# Patient Record
Sex: Female | Born: 1995 | Race: White | Hispanic: No | Marital: Single | Smoking: Former smoker
Health system: Southern US, Community
[De-identification: ages and names within clinical notes are randomized; demographics above are authoritative.]

## PROBLEM LIST (undated history)

## (undated) DIAGNOSIS — Z789 Other specified health status: Secondary | ICD-10-CM

## (undated) DIAGNOSIS — S129XXA Fracture of neck, unspecified, initial encounter: Secondary | ICD-10-CM

## (undated) DIAGNOSIS — D649 Anemia, unspecified: Secondary | ICD-10-CM

## (undated) DIAGNOSIS — F419 Anxiety disorder, unspecified: Secondary | ICD-10-CM

## (undated) HISTORY — DX: Anxiety disorder, unspecified: F41.9

## (undated) HISTORY — DX: Anemia, unspecified: D64.9

## (undated) HISTORY — DX: Fracture of neck, unspecified, initial encounter: S12.9XXA

## (undated) HISTORY — PX: NO PAST SURGERIES: SHX2092

---

## 2018-11-07 ENCOUNTER — Encounter (HOSPITAL_COMMUNITY): Payer: Self-pay

## 2018-11-07 ENCOUNTER — Other Ambulatory Visit: Payer: Self-pay

## 2018-11-07 ENCOUNTER — Inpatient Hospital Stay (HOSPITAL_COMMUNITY)
Admission: EM | Admit: 2018-11-07 | Discharge: 2018-11-07 | Disposition: A | Discharge status: Home / Self Care | Payer: 59 | Attending: Obstetrics and Gynecology | Admitting: Obstetrics and Gynecology

## 2018-11-07 ENCOUNTER — Inpatient Hospital Stay (HOSPITAL_COMMUNITY): Payer: 59

## 2018-11-07 DIAGNOSIS — O4690 Antepartum hemorrhage, unspecified, unspecified trimester: Secondary | ICD-10-CM

## 2018-11-07 DIAGNOSIS — F17210 Nicotine dependence, cigarettes, uncomplicated: Secondary | ICD-10-CM | POA: Diagnosis not present

## 2018-11-07 DIAGNOSIS — O99331 Smoking (tobacco) complicating pregnancy, first trimester: Secondary | ICD-10-CM | POA: Diagnosis not present

## 2018-11-07 DIAGNOSIS — Z6790 Unspecified blood type, Rh positive: Secondary | ICD-10-CM

## 2018-11-07 DIAGNOSIS — Z3A01 Less than 8 weeks gestation of pregnancy: Secondary | ICD-10-CM | POA: Diagnosis not present

## 2018-11-07 DIAGNOSIS — O3680X0 Pregnancy with inconclusive fetal viability, not applicable or unspecified: Secondary | ICD-10-CM | POA: Diagnosis not present

## 2018-11-07 DIAGNOSIS — O209 Hemorrhage in early pregnancy, unspecified: Secondary | ICD-10-CM | POA: Diagnosis not present

## 2018-11-07 HISTORY — DX: Other specified health status: Z78.9

## 2018-11-07 LAB — CBC
HCT: 40.4 % (ref 36.0–46.0)
Hemoglobin: 13.4 g/dL (ref 12.0–15.0)
MCH: 28.7 pg (ref 26.0–34.0)
MCHC: 33.2 g/dL (ref 30.0–36.0)
MCV: 86.5 fL (ref 80.0–100.0)
Platelets: 340 10*3/uL (ref 150–400)
RBC: 4.67 MIL/uL (ref 3.87–5.11)
RDW: 12 % (ref 11.5–15.5)
WBC: 9.3 10*3/uL (ref 4.0–10.5)
nRBC: 0 % (ref 0.0–0.2)

## 2018-11-07 LAB — URINALYSIS, ROUTINE W REFLEX MICROSCOPIC
Bacteria, UA: NONE SEEN
Bilirubin Urine: NEGATIVE
Glucose, UA: NEGATIVE mg/dL
Ketones, ur: NEGATIVE mg/dL
Leukocytes,Ua: NEGATIVE
Nitrite: NEGATIVE
Protein, ur: NEGATIVE mg/dL
Specific Gravity, Urine: 1.014 (ref 1.005–1.030)
pH: 8 (ref 5.0–8.0)

## 2018-11-07 LAB — COMPREHENSIVE METABOLIC PANEL
ALT: 18 U/L (ref 0–44)
AST: 19 U/L (ref 15–41)
Albumin: 4.6 g/dL (ref 3.5–5.0)
Alkaline Phosphatase: 73 U/L (ref 38–126)
Anion gap: 10 (ref 5–15)
BUN: 8 mg/dL (ref 6–20)
CO2: 25 mmol/L (ref 22–32)
Calcium: 10.3 mg/dL (ref 8.9–10.3)
Chloride: 103 mmol/L (ref 98–111)
Creatinine, Ser: 0.64 mg/dL (ref 0.44–1.00)
GFR calc Af Amer: >60 mL/min (ref >60)
GFR calc non Af Amer: >60 mL/min (ref >60)
Glucose, Bld: 95 mg/dL (ref 70–99)
Potassium: 3.9 mmol/L (ref 3.5–5.1)
Sodium: 138 mmol/L (ref 135–145)
Total Bilirubin: 0.7 mg/dL (ref 0.3–1.2)
Total Protein: 7.5 g/dL (ref 6.5–8.1)

## 2018-11-07 LAB — WET PREP, GENITAL
Sperm: NONE SEEN
Trich, Wet Prep: NONE SEEN
Yeast Wet Prep HPF POC: NONE SEEN

## 2018-11-07 LAB — POCT PREGNANCY, URINE: Preg Test, Ur: POSITIVE — AB

## 2018-11-07 LAB — HCG, QUANTITATIVE, PREGNANCY: hCG, Beta Chain, Quant, S: 990 m[IU]/mL — ABNORMAL HIGH (ref <5)

## 2018-11-07 NOTE — ED Triage Notes (Signed)
Report to [facility] transport

## 2018-11-07 NOTE — MAU Provider Note (Signed)
History     CSN: [id number]  Arrival date and time: 11/07/18 1257   First Provider Initiated Contact with Patient 11/07/18 1429      Chief Complaint  Patient presents with  . Abdominal Pain  . Vaginal Bleeding   [name] is a 23 y.o. G1P0 at [redacted]w[redacted]d who presents to MAU for vaginal bleeding which began yesterday. Pt reports the bleeding started out as spotting, after intercourse had a slight increase in spotting and then last night reports that the bleeding "turned into a period."  Passing blood clots? no Blood soaking clothes? no Lightheaded/dizzy? no Significant pelvic pain or cramping? yes, started a few days ago, worse last night, no pain this morning Passed any tissue? no Hx ectopic pregnancy? no Hx of PID, GYN surgery? no  Current pregnancy problems? abnormally rising hCG Blood Type? unknown Allergies? NKDA Current medications? PNVs Current [facility] & next appt? [place], 11/09/2018  Pt denies vaginal discharge/odor/itching. Pt denies N/V, abdominal pain, constipation, diarrhea, or urinary problems. Pt denies fever, chills, fatigue, sweating or changes in appetite. Pt denies SOB or chest pain. Pt denies dizziness, HA, light-headedness, weakness.  Pt's boyfriend present for entire visit.   OB History    Gravida  1   Para      Term      Preterm      AB      Living        SAB      TAB      Ectopic      Multiple      Live Births              Past Medical History:  Diagnosis Date  . Medical history non-contributory     Past Surgical History:  Procedure Laterality Date  . NO PAST SURGERIES      History reviewed. No pertinent family history.  Social History   Tobacco Use  . Smoking status: Current Every Day Smoker    Types: Cigarettes  . Smokeless tobacco: Never Used  Substance Use Topics  . Alcohol use: Not Currently  . Drug use: Never    Allergies: No Known Allergies  No medications prior to admission.    Review of  Systems  Constitutional: Negative for chills, diaphoresis, fatigue and fever.  Respiratory: Negative for shortness of breath.   Cardiovascular: Negative for chest pain.  Gastrointestinal: Negative for abdominal pain, constipation, diarrhea, nausea and vomiting.  Genitourinary: Positive for vaginal bleeding. Negative for dysuria, flank pain, frequency, pelvic pain, urgency and vaginal discharge.  Neurological: Negative for dizziness, weakness, light-headedness and headaches.   Physical Exam   Blood pressure 117/76, pulse 81, temperature 98.3 F (36.8 C), temperature source Oral, resp. rate 18, last menstrual period 09/19/2018, SpO2 99 %.  Patient Vitals for the past 24 hrs:  BP Temp Temp src Pulse Resp SpO2  11/07/18 1415 117/76 98.3 F (36.8 C) Oral 81 18 99 %   Physical Exam  Constitutional: She is oriented to person, place, and time. She appears well-developed and well-nourished. No distress.  HENT:  Head: Normocephalic and atraumatic.  Respiratory: Effort normal.  GI: Soft. She exhibits no distension and no mass. There is no abdominal tenderness. There is no rebound and no guarding.  Genitourinary: There is no rash, tenderness or lesion on the right labia. There is no rash, tenderness or lesion on the left labia. Uterus is not enlarged and not tender. Cervix exhibits no motion tenderness, no discharge and no friability. Right adnexum  displays no mass, no tenderness and no fullness. Left adnexum displays no mass, no tenderness and no fullness.    Vaginal bleeding (scant, liquid blood in vault) present.     No vaginal discharge or tenderness.  There is bleeding (scant, liquid blood in vault) in the vagina. No tenderness in the vagina.    Genitourinary Comments: On exam, long/thin tissue found wrapped around cervix in fornices. Removed gently with single Fox swab and sent to pathology.   Neurological: She is alert and oriented to person, place, and time.  Skin: Skin is warm and dry. She  is not diaphoretic.  Psychiatric: She has a normal mood and affect. Her behavior is normal. Judgment and thought content normal.   Results for orders placed or performed during the hospital encounter of 11/07/18 (from the past 24 hour(s))  Pregnancy, urine POC     Status: Abnormal   Collection Time: 11/07/18  2:02 PM  Result Value Ref Range   Preg Test, Ur POSITIVE (A) NEGATIVE  Urinalysis, Routine w reflex microscopic     Status: Abnormal   Collection Time: 11/07/18  2:03 PM  Result Value Ref Range   Color, Urine YELLOW YELLOW   APPearance CLEAR CLEAR   Specific Gravity, Urine 1.014 1.005 - 1.030   pH 8.0 5.0 - 8.0   Glucose, UA NEGATIVE NEGATIVE mg/dL   Hgb urine dipstick LARGE (A) NEGATIVE   Bilirubin Urine NEGATIVE NEGATIVE   Ketones, ur NEGATIVE NEGATIVE mg/dL   Protein, ur NEGATIVE NEGATIVE mg/dL   Nitrite NEGATIVE NEGATIVE   Leukocytes,Ua NEGATIVE NEGATIVE   RBC / HPF 21-50 0 - 5 RBC/hpf   WBC, UA 0-5 0 - 5 WBC/hpf   Bacteria, UA NONE SEEN NONE SEEN   Squamous Epithelial / LPF 0-5 0 - 5   Mucus PRESENT   CBC     Status: None   Collection Time: 11/07/18  2:34 PM  Result Value Ref Range   WBC 9.3 4.0 - 10.5 K/uL   RBC 4.67 3.87 - 5.11 MIL/uL   Hemoglobin 13.4 12.0 - 15.0 g/dL   HCT 40.4 36.0 - 46.0 %   MCV 86.5 80.0 - 100.0 fL   MCH 28.7 26.0 - 34.0 pg   MCHC 33.2 30.0 - 36.0 g/dL   RDW 12.0 11.5 - 15.5 %   Platelets 340 150 - 400 K/uL   nRBC 0.0 0.0 - 0.2 %  Comprehensive metabolic panel     Status: None   Collection Time: 11/07/18  2:34 PM  Result Value Ref Range   Sodium 138 135 - 145 mmol/L   Potassium 3.9 3.5 - 5.1 mmol/L   Chloride 103 98 - 111 mmol/L   CO2 25 22 - 32 mmol/L   Glucose, Bld 95 70 - 99 mg/dL   BUN 8 6 - 20 mg/dL   Creatinine, Ser 0.64 0.44 - 1.00 mg/dL   Calcium 10.3 8.9 - 10.3 mg/dL   Total Protein 7.5 6.5 - 8.1 g/dL   Albumin 4.6 3.5 - 5.0 g/dL   AST 19 15 - 41 U/L   ALT 18 0 - 44 U/L   Alkaline Phosphatase 73 38 - 126 U/L   Total  Bilirubin 0.7 0.3 - 1.2 mg/dL   GFR calc non Af Amer >60 >60 mL/min   GFR calc Af Amer >60 >60 mL/min   Anion gap 10 5 - 15  ABO/Rh     Status: None   Collection Time: 11/07/18  2:45 PM  Result Value  Ref Range   ABO/RH(D)      O POS Performed at [facility], [address] [place], [place] [address]   hCG, quantitative, pregnancy     Status: Abnormal   Collection Time: 11/07/18  2:50 PM  Result Value Ref Range   hCG, Beta Chain, Quant, S 990 (H) <5 mIU/mL  Wet prep, genital     Status: Abnormal   Collection Time: 11/07/18  3:05 PM  Result Value Ref Range   Yeast Wet Prep HPF POC NONE SEEN NONE SEEN   Trich, Wet Prep NONE SEEN NONE SEEN   Clue Cells Wet Prep HPF POC PRESENT (A) NONE SEEN   WBC, Wet Prep HPF POC RARE (A) NONE SEEN   Sperm NONE SEEN    No results found.  MAU Course  Procedures  MDM -r/o ectopic -UA: lg hgb, otherwise WNL -CBC: WNL -CMP: WNL -US: no IUP, pregnancy of unknown location -hCG: 990 -ABO: O Positive -WetPrep: +ClueCells (isolated finding not requiring treatment), rare WBCs, otherwise WNL -GC/CT collected -tissue sent to pathology -pt discharged to home in stable condition  Orders Placed This Encounter  Procedures  . Wet prep, genital    Standing Status:   Standing    Number of Occurrences:   1  . US OB LESS THAN 14 WEEKS WITH OB TRANSVAGINAL    Standing Status:   Standing    Number of Occurrences:   1    Order Specific Question:   Symptom/Reason for Exam    Answer:   Vaginal bleeding in pregnancy [[id number]]  . Urinalysis, Routine w reflex microscopic    Standing Status:   Standing    Number of Occurrences:   1  . CBC    Standing Status:   Standing    Number of Occurrences:   1  . Comprehensive metabolic panel    Standing Status:   Standing    Number of Occurrences:   1  . hCG, quantitative, pregnancy    Standing Status:   Standing    Number of Occurrences:   1  . Pregnancy, urine POC    Standing Status:   Standing     Number of Occurrences:   1  . ABO/Rh    Standing Status:   Standing    Number of Occurrences:   1   No orders of the defined types were placed in this encounter.  Assessment and Plan   1. Pregnancy of unknown anatomic location   2. Vaginal bleeding in pregnancy   3. Blood type, Rh positive    Allergies as of 11/07/2018   No Known Allergies     Medication List    You have not been prescribed any medications.    -will call with pathology results, if abnormal -[facility]10/2018 @0830AM  for repeat hCG -strict SAB/ectopic/VB/pain/return MAU precautions given -pt discharged to home in stable condition  Joni Reiningicole E Takeyla Million 11/07/2018, 2:30 PM

## 2018-11-07 NOTE — Discharge Instructions (Signed)
Ectopic Pregnancy ° °An ectopic pregnancy is when the fertilized egg attaches (implants) outside the uterus. Most ectopic pregnancies occur in one of the tubes where eggs travel from the ovary to the uterus (fallopian tubes), but the implanting can occur in other locations. In rare cases, ectopic pregnancies occur on the ovary, intestine, pelvis, abdomen, or cervix. In an ectopic pregnancy, the fertilized egg does not have the ability to develop into a normal, healthy baby. °A ruptured ectopic pregnancy is one in which tearing or bursting of a fallopian tube causes internal bleeding. Often, there is intense lower abdominal pain, and vaginal bleeding sometimes occurs. Having an ectopic pregnancy can be life-threatening. If this dangerous condition is not treated, it can lead to blood loss, shock, or even death. °What are the causes? °The most common cause of this condition is damage to one of the fallopian tubes. A fallopian tube may be narrowed or blocked, and that keeps the fertilized egg from reaching the uterus. °What increases the risk? °This condition is more likely to develop in women of childbearing age who have different levels of risk. The levels of risk can be divided into three categories. °High risk °· You have gone through infertility treatment. °· You have had an ectopic pregnancy before. °· You have had surgery on the fallopian tubes, or another surgical procedure, such as an abortion. °· You have had surgery to have the fallopian tubes tied (tubal ligation). °· You have problems or diseases of the fallopian tubes. °· You have been exposed to diethylstilbestrol (DES). This medicine was used until 1971, and it had effects on babies whose mothers took the medicine. °· You become pregnant while using an IUD (intrauterine device) for birth control. °Moderate risk °· You have a history of infertility. °· You have had an STI (sexually transmitted infection). °· You have a history of pelvic inflammatory  disease (PID). °· You have scarring from endometriosis. °· You have multiple sexual partners. °· You smoke. °Low risk °· You have had pelvic surgery. °· You use vaginal douches. °· You became sexually active before age 18. °What are the signs or symptoms? °Common symptoms of this condition include normal pregnancy symptoms, such as missing a period, nausea, tiredness, abdominal pain, breast tenderness, and bleeding. However, ectopic pregnancy will have additional symptoms, such as: °· Pain with intercourse. °· Irregular vaginal bleeding or spotting. °· Cramping or pain on one side or in the lower abdomen. °· Fast heartbeat, low blood pressure, and sweating. °· Passing out while having a bowel movement. °Symptoms of a ruptured ectopic pregnancy and internal bleeding may include: °· Sudden, severe pain in the abdomen and pelvis. °· Dizziness, weakness, light-headedness, or fainting. °· Pain in the shoulder or neck area. °How is this diagnosed? °This condition is diagnosed by: °· A pelvic exam to locate pain or a mass in the abdomen. °· A pregnancy test. This blood test checks for the presence as well as the specific level of pregnancy hormone in the bloodstream. °· Ultrasound. This is performed if a pregnancy test is positive. In this test, a probe is inserted into the vagina. The probe will detect a fetus, possibly in a location other than the uterus. °· Taking a sample of uterus tissue (dilation and curettage, or D&C). °· Surgery to perform a visual exam of the inside of the abdomen using a thin, lighted tube that has a tiny camera on the end (laparoscope). °· Culdocentesis. This procedure involves inserting a needle at the top of   the vagina, behind the uterus. If blood is present in this area, it may indicate that a fallopian tube is torn. How is this treated? This condition is treated with medicine or surgery. Medicine  An injection of a medicine (methotrexate) may be given to cause the pregnancy tissue to be  absorbed. This medicine may save your fallopian tube. It may be given if: ? The diagnosis is made early, with no signs of active bleeding. ? The fallopian tube has not ruptured. ? You are considered to be a good candidate for the medicine. Usually, pregnancy hormone blood levels are checked after methotrexate treatment. This is to be sure that the medicine is effective. It may take 4-6 weeks for the pregnancy to be absorbed. Most pregnancies will be absorbed by 3 weeks. Surgery  A laparoscope may be used to remove the pregnancy tissue.  If severe internal bleeding occurs, a larger cut (incision) may be made in the lower abdomen (laparotomy) to remove the fetus and placenta. This is done to stop the bleeding.  Part or all of the fallopian tube may be removed (salpingectomy) along with the fetus and placenta. The fallopian tube may also be repaired during the surgery.  In very rare circumstances, removal of the uterus (hysterectomy) may be required.  After surgery, pregnancy hormone testing may be done to be sure that there is no pregnancy tissue left. Whether your treatment is medicine or surgery, you may receive a Rho (D) immune globulin shot to prevent problems with any future pregnancy. This shot may be given if:  You are Rh-negative and the baby's father is Rh-positive.  You are Rh-negative and you do not know the Rh type of the baby's father. Follow these instructions at home:  Rest and limit your activity after the procedure for as long as told by your health care provider.  Until your health care provider says that it is safe: ? Do not lift anything that is heavier than 10 lb (4.5 kg), or the limit that your health care provider tells you. ? Avoid physical exercise and any movement that requires effort (is strenuous).  To help prevent constipation: ? Eat a healthy diet that includes fruits, vegetables, and whole grains. ? Drink 6-8 glasses of water per day. Get help right away  if:  You develop worsening pain that is not relieved by medicine.  You have: ? A fever or chills. ? Vaginal bleeding. ? Redness and swelling at the incision site. ? Nausea and vomiting.  You feel dizzy or weak.  You feel light-headed or you faint. This information is not intended to replace advice given to you by your health care provider. Make sure you discuss any questions you have with your health care provider. Document Released: 03/21/2004 Document Revised: 01/24/2017 Document Reviewed: 09/13/2015 Elsevier Patient Education  [place] A miscarriage is the loss of an unborn baby (fetus) before the 20th week of pregnancy. Most miscarriages happen during the first 3 months of pregnancy. Sometimes, a miscarriage can happen before a woman knows that she is pregnant. Having a miscarriage can be an emotional experience. If you have had a miscarriage, talk with your health care provider about any questions you may have about miscarrying, the grieving process, and your plans for future pregnancy. What are the causes? A miscarriage may be caused by:  Problems with the genes or chromosomes of the fetus. These problems make it impossible for the baby to develop normally. They are often the result of  random errors that occur early in the development of the baby, and are not passed from parent to child (not inherited). °· Infection of the cervix or uterus. °· Conditions that affect hormone balance in the body. °· Problems with the cervix, such as the cervix opening and thinning before pregnancy is at term (cervical insufficiency). °· Problems with the uterus. These may include: °? A uterus with an abnormal shape. °? Fibroids in the uterus. °? Congenital abnormalities. These are problems that were present at birth. °· Certain medical conditions. °· Smoking, drinking alcohol, or using drugs. °· Injury (trauma). °In many cases, the cause of a miscarriage is not known. °What are the  signs or symptoms? °Symptoms of this condition include: °· Vaginal bleeding or spotting, with or without cramps or pain. °· Pain or cramping in the abdomen or lower back. °· Passing fluid, tissue, or blood clots from the vagina. °How is this diagnosed? °This condition may be diagnosed based on: °· A physical exam. °· Ultrasound. °· Blood tests. °· Urine tests. °How is this treated? °Treatment for a miscarriage is sometimes not necessary if you naturally pass all the tissue that was in your uterus. If necessary, this condition may be treated with: °· Dilation and curettage (D&C). This is a procedure in which the cervix is stretched open and the lining of the uterus (endometrium) is scraped. This is done only if tissue from the fetus or placenta remains in the body (incomplete miscarriage). °· Medicines, such as: °? Antibiotic medicine, to treat infection. °? Medicine to help the body pass any remaining tissue. °? Medicine to reduce (contract) the size of the uterus. These medicines may be given if you have a lot of bleeding. °If you have Rh negative blood and your baby was Rh positive, you will need a shot of a medicine called Rh immunoglobulinto protect your future babies from Rh blood problems. "Rh-negative" and "Rh-positive" refer to whether or not the blood has a specific protein found on the surface of red blood cells (Rh factor). °Follow these instructions at home: °Medicines ° °· Take over-the-counter and prescription medicines only as told by your health care provider. °· If you were prescribed antibiotic medicine, take it as told by your health care provider. Do not stop taking the antibiotic even if you start to feel better. °· Do not take NSAIDs, such as aspirin and ibuprofen, unless they are approved by your health care provider. These medicines can cause bleeding. °Activity °· Rest as directed. Ask your health care provider what activities are safe for you. °· Have someone help with home and family  responsibilities during this time. °General instructions °· Keep track of the number of sanitary pads you use each day and how soaked (saturated) they are. Write down this information. °· Monitor the amount of tissue or blood clots that you pass from your vagina. Save any large amounts of tissue for your health care provider to examine. °· Do not use tampons, douche, or have sex until your health care provider approves. °· To help you and your partner with the process of grieving, talk with your health care provider or seek counseling. °· When you are ready, meet with your health care provider to discuss any important steps you should take for your health. Also, discuss steps you should take to have a healthy pregnancy in the future. °· Keep all follow-up visits as told by your health care provider. This is important. °Where to find more information °· The American   Congress of Obstetricians and Gynecologists: www.acog.org  [place] Department of Health and Cytogeneticist of Womens Health: http://hoffman.com/ Contact a health care provider if:  You have a fever or chills.  You have a foul smelling vaginal discharge.  You have more bleeding instead of less. Get help right away if:  You have severe cramps or pain in your back or abdomen.  You pass blood clots or tissue from your vagina that is walnut-sized or larger.  You soak more than 1 regular sanitary pad in an hour.  You become light-headed or weak.  You pass out.  You have feelings of sadness that take over your thoughts, or you have thoughts of hurting yourself. Summary  Most miscarriages happen in the first 3 months of pregnancy. Sometimes miscarriage happens before a woman even knows that she is pregnant.  Follow your health care provider's instruction for home care. Keep all follow-up appointments.  To help you and your partner with the process of grieving, talk with your health care provider or seek counseling. This  information is not intended to replace advice given to you by your health care provider. Make sure you discuss any questions you have with your health care provider. Document Released: 08/07/2000 Document Revised: 06/05/2018 Document Reviewed: 03/19/2016 Elsevier Patient Education  2020 Elsevier Inc. Vaginal Bleeding During Pregnancy, First Trimester  A small amount of bleeding from the vagina (spotting) is relatively common during early pregnancy. It usually stops on its own. Various things may cause bleeding or spotting during early pregnancy. Some bleeding may be related to the pregnancy, and some may not. In many cases, the bleeding is normal and is not a problem. However, bleeding can also be a sign of something serious. Be sure to tell your health care provider about any vaginal bleeding right away. Some possible causes of vaginal bleeding during the first trimester include:  Infection or inflammation of the cervix.  Growths (polyps) on the cervix.  Miscarriage or threatened miscarriage.  Pregnancy tissue developing outside of the uterus (ectopic pregnancy).  A mass of tissue developing in the uterus due to an egg being fertilized incorrectly (molar pregnancy). Follow these instructions at home: Activity  Follow instructions from your health care provider about limiting your activity. Ask what activities are safe for you.  If needed, make plans for someone to help with your regular activities.  Do not have sex or orgasms until your health care provider says that this is safe. General instructions  Take over-the-counter and prescription medicines only as told by your health care provider.  Pay attention to any changes in your symptoms.  Do not use tampons or douche.  Write down how many pads you use each day, how often you change pads, and how soaked (saturated) they are.  If you pass any tissue from your vagina, save the tissue so you can show it to your health care  provider.  Keep all follow-up visits as told by your health care provider. This is important. Contact a health care provider if:  You have vaginal bleeding during any part of your pregnancy.  You have cramps or labor pains.  You have a fever. Get help right away if:  You have severe cramps in your back or abdomen.  You pass large clots or a large amount of tissue from your vagina.  Your bleeding increases.  You feel light-headed or weak, or you faint.  You have chills.  You are leaking fluid or have a gush of fluid  from your vagina. Summary  A small amount of bleeding (spotting) from the vagina is relatively common during early pregnancy.  Various things may cause bleeding or spotting in early pregnancy.  Be sure to tell your health care provider about any vaginal bleeding right away. This information is not intended to replace advice given to you by your health care provider. Make sure you discuss any questions you have with your health care provider. Document Released: 11/21/2004 Document Revised: 06/02/2018 Document Reviewed: 05/16/2016 Elsevier Patient Education  2020 [employer]

## 2018-11-07 NOTE — MAU Note (Signed)
Michelle Woodard is a 23 y.o. at [redacted]w[redacted]d here in MAU reporting: has been to see gyn and they did an u/s, told her they did not see anything in her uterus so they have been doing hcg and the first one was 494 and then the next one was 664. Was told if she has any bleeding to come in. Started having some bleeding since yesterday, has changed a pad 3-4 times and it has a small amount of bleeding on it, no clots. Having some abdominal pain.  Onset of complaint: last night  Pain score: 9/10  Vitals:   11/07/18 1415  BP: 117/76  Pulse: 81  Resp: 18  Temp: 98.3 F (36.8 C)  SpO2: 99%      Lab orders placed from triage: UA, UPT

## 2018-11-08 LAB — ABO/RH: ABO/RH(D): O POS

## 2018-11-10 ENCOUNTER — Ambulatory Visit (INDEPENDENT_AMBULATORY_CARE_PROVIDER_SITE_OTHER): Payer: Self-pay | Admitting: *Deleted

## 2018-11-10 ENCOUNTER — Other Ambulatory Visit: Payer: Self-pay

## 2018-11-10 VITALS — BP 106/65 | HR 79

## 2018-11-10 DIAGNOSIS — O3680X0 Pregnancy with inconclusive fetal viability, not applicable or unspecified: Secondary | ICD-10-CM

## 2018-11-10 LAB — GC/CHLAMYDIA PROBE AMP (~~LOC~~) NOT AT ARMC
Chlamydia: NEGATIVE
Neisseria Gonorrhea: NEGATIVE

## 2018-11-10 LAB — BETA HCG QUANT (REF LAB): hCG Quant: 552 m[IU]/mL

## 2018-11-10 NOTE — Progress Notes (Signed)
Patient seen and assessed by nursing staff during this encounter. I have reviewed the chart and agree with the documentation and plan.  Concern is high for SAB given change in hCG however did not have a 50% drop, so will repeat in 48 hours to confirm. This is a desired pregnancy.   Kerry Hough, PA-C 11/10/2018 11:33 AM

## 2018-11-10 NOTE — Progress Notes (Signed)
Pt denies having pain or cramping. She is still having some bleeding however not as much as previously - sometimes bright red but mostly brown. Stat BHCG drawn and pt will be called with results. She voiced understanding.   1100  BHCG results received and chart was reviewed by Kerry Hough, PA. She advised repeat Stat BHCG in 2 days. Pt should return to [facility] of her symptoms worsen. Pt was called and informed of results and plan of care recommendations. She voiced understanding and agreed to appt on 9/17 @ 1100.

## 2018-11-11 ENCOUNTER — Telehealth: Payer: Self-pay | Admitting: Women's Health

## 2018-11-11 NOTE — Telephone Encounter (Signed)
Pt called re: path results. Pt identified by two identifiers. Made patient aware that pathology results showed polypoid tissue and were not related to pregnancy. Pt voiced understanding and all questions answered to patient satisfaction.  Nugent, Gerrie Nordmann, NP  9:10 AM 11/11/2018

## 2018-11-12 ENCOUNTER — Telehealth: Payer: Self-pay | Admitting: Family Medicine

## 2018-11-12 ENCOUNTER — Ambulatory Visit: Payer: 59

## 2018-11-12 NOTE — Telephone Encounter (Signed)
Patient called stating she has been in so much pain since 5:00 this morning. Per Morey Hummingbird RN, she was told to got the the [facility] and cancel the appointment with [place].

## 2019-02-20 ENCOUNTER — Emergency Department (HOSPITAL_COMMUNITY): Payer: Medicaid Other

## 2019-02-20 ENCOUNTER — Encounter (HOSPITAL_COMMUNITY): Payer: Self-pay

## 2019-02-20 ENCOUNTER — Inpatient Hospital Stay (HOSPITAL_COMMUNITY)
Admission: EM | Admit: 2019-02-20 | Discharge: 2019-02-24 | DRG: 552 | Payer: Medicaid Other | Attending: General Surgery | Admitting: General Surgery

## 2019-02-20 DIAGNOSIS — Y92410 Unspecified street and highway as the place of occurrence of the external cause: Secondary | ICD-10-CM

## 2019-02-20 DIAGNOSIS — T07XXXA Unspecified multiple injuries, initial encounter: Secondary | ICD-10-CM

## 2019-02-20 DIAGNOSIS — S2232XA Fracture of one rib, left side, initial encounter for closed fracture: Secondary | ICD-10-CM

## 2019-02-20 DIAGNOSIS — S92155A Nondisplaced avulsion fracture (chip fracture) of left talus, initial encounter for closed fracture: Secondary | ICD-10-CM | POA: Diagnosis present

## 2019-02-20 DIAGNOSIS — S12591A Other nondisplaced fracture of sixth cervical vertebra, initial encounter for closed fracture: Secondary | ICD-10-CM

## 2019-02-20 DIAGNOSIS — S82863A Displaced Maisonneuve's fracture of unspecified leg, initial encounter for closed fracture: Secondary | ICD-10-CM

## 2019-02-20 DIAGNOSIS — S8255XA Nondisplaced fracture of medial malleolus of left tibia, initial encounter for closed fracture: Secondary | ICD-10-CM | POA: Diagnosis present

## 2019-02-20 DIAGNOSIS — F17210 Nicotine dependence, cigarettes, uncomplicated: Secondary | ICD-10-CM | POA: Diagnosis present

## 2019-02-20 DIAGNOSIS — Y907 Blood alcohol level of 200-239 mg/100 ml: Secondary | ICD-10-CM | POA: Diagnosis present

## 2019-02-20 DIAGNOSIS — S8252XA Displaced fracture of medial malleolus of left tibia, initial encounter for closed fracture: Secondary | ICD-10-CM | POA: Diagnosis present

## 2019-02-20 DIAGNOSIS — R000 Tachycardia, unspecified: Secondary | ICD-10-CM

## 2019-02-20 DIAGNOSIS — S92102A Unspecified fracture of left talus, initial encounter for closed fracture: Secondary | ICD-10-CM | POA: Diagnosis present

## 2019-02-20 DIAGNOSIS — F10129 Alcohol abuse with intoxication, unspecified: Secondary | ICD-10-CM | POA: Diagnosis present

## 2019-02-20 DIAGNOSIS — S32020A Wedge compression fracture of second lumbar vertebra, initial encounter for closed fracture: Secondary | ICD-10-CM

## 2019-02-20 DIAGNOSIS — Z20828 Contact with and (suspected) exposure to other viral communicable diseases: Secondary | ICD-10-CM | POA: Diagnosis present

## 2019-02-20 DIAGNOSIS — R402362 Coma scale, best motor response, obeys commands, at arrival to emergency department: Secondary | ICD-10-CM | POA: Diagnosis present

## 2019-02-20 DIAGNOSIS — R402142 Coma scale, eyes open, spontaneous, at arrival to emergency department: Secondary | ICD-10-CM | POA: Diagnosis present

## 2019-02-20 DIAGNOSIS — S12691A Other nondisplaced fracture of seventh cervical vertebra, initial encounter for closed fracture: Principal | ICD-10-CM

## 2019-02-20 DIAGNOSIS — R402252 Coma scale, best verbal response, oriented, at arrival to emergency department: Secondary | ICD-10-CM | POA: Diagnosis present

## 2019-02-20 LAB — COMPREHENSIVE METABOLIC PANEL
ALT: 48 U/L — ABNORMAL HIGH (ref 0–44)
AST: 81 U/L — ABNORMAL HIGH (ref 15–41)
Albumin: 4.6 g/dL (ref 3.5–5.0)
Alkaline Phosphatase: 75 U/L (ref 38–126)
Anion gap: 12 (ref 5–15)
BUN: 5 mg/dL — ABNORMAL LOW (ref 6–20)
CO2: 22 mmol/L (ref 22–32)
Calcium: 8.9 mg/dL (ref 8.9–10.3)
Chloride: 111 mmol/L (ref 98–111)
Creatinine, Ser: 0.56 mg/dL (ref 0.44–1.00)
GFR calc Af Amer: >60 mL/min (ref >60)
GFR calc non Af Amer: >60 mL/min (ref >60)
Glucose, Bld: 124 mg/dL — ABNORMAL HIGH (ref 70–99)
Potassium: 3.5 mmol/L (ref 3.5–5.1)
Sodium: 145 mmol/L (ref 135–145)
Total Bilirubin: 0.3 mg/dL (ref 0.3–1.2)
Total Protein: 7.4 g/dL (ref 6.5–8.1)

## 2019-02-20 LAB — I-STAT CHEM 8, ED
BUN: 4 mg/dL — ABNORMAL LOW (ref 6–20)
Calcium, Ion: 1.1 mmol/L — ABNORMAL LOW (ref 1.15–1.40)
Chloride: 107 mmol/L (ref 98–111)
Creatinine, Ser: 0.7 mg/dL (ref 0.44–1.00)
Glucose, Bld: 118 mg/dL — ABNORMAL HIGH (ref 70–99)
HCT: 43 % (ref 36.0–46.0)
Hemoglobin: 14.6 g/dL (ref 12.0–15.0)
Potassium: 3.4 mmol/L — ABNORMAL LOW (ref 3.5–5.1)
Sodium: 146 mmol/L — ABNORMAL HIGH (ref 135–145)
TCO2: 24 mmol/L (ref 22–32)

## 2019-02-20 LAB — PROTIME-INR
INR: 1.2 (ref 0.8–1.2)
Prothrombin Time: 14.9 seconds (ref 11.4–15.2)

## 2019-02-20 LAB — CBC
HCT: 42.2 % (ref 36.0–46.0)
Hemoglobin: 14.2 g/dL (ref 12.0–15.0)
MCH: 28.6 pg (ref 26.0–34.0)
MCHC: 33.6 g/dL (ref 30.0–36.0)
MCV: 84.9 fL (ref 80.0–100.0)
Platelets: 369 10*3/uL (ref 150–400)
RBC: 4.97 MIL/uL (ref 3.87–5.11)
RDW: 12.1 % (ref 11.5–15.5)
WBC: 26.8 10*3/uL — ABNORMAL HIGH (ref 4.0–10.5)
nRBC: 0 % (ref 0.0–0.2)

## 2019-02-20 LAB — ETHANOL: Alcohol, Ethyl (B): 206 mg/dL — ABNORMAL HIGH (ref <10)

## 2019-02-20 LAB — SAMPLE TO BLOOD BANK

## 2019-02-20 LAB — I-STAT BETA HCG BLOOD, ED (MC, WL, AP ONLY): I-stat hCG, quantitative: >2000 m[IU]/mL — ABNORMAL HIGH (ref <5)

## 2019-02-20 LAB — LACTIC ACID, PLASMA: Lactic Acid, Venous: 2.9 mmol/L (ref 0.5–1.9)

## 2019-02-20 MED ORDER — SODIUM CHLORIDE 0.9 % IV BOLUS
500.0000 mL | Freq: Once | INTRAVENOUS | Status: AC
  Administered 2019-02-20: 23:00:00 500 mL via INTRAVENOUS
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
## 2019-02-20 NOTE — ED Notes (Signed)
Pt has scrapes on hips bilat, ecchymosis on left hand, scratches on right hand, red scratches on upper left chest.
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
## 2019-02-20 NOTE — ED Notes (Signed)
Pt called for room with no answer  Called radiology and pt not over there
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
## 2019-02-21 ENCOUNTER — Inpatient Hospital Stay (HOSPITAL_COMMUNITY): Payer: Medicaid Other

## 2019-02-21 ENCOUNTER — Emergency Department (HOSPITAL_COMMUNITY): Payer: Medicaid Other

## 2019-02-21 ENCOUNTER — Other Ambulatory Visit: Payer: Self-pay

## 2019-02-21 ENCOUNTER — Encounter (HOSPITAL_COMMUNITY): Payer: Self-pay

## 2019-02-21 DIAGNOSIS — Y907 Blood alcohol level of 200-239 mg/100 ml: Secondary | ICD-10-CM | POA: Diagnosis present

## 2019-02-21 DIAGNOSIS — S8252XA Displaced fracture of medial malleolus of left tibia, initial encounter for closed fracture: Secondary | ICD-10-CM | POA: Diagnosis present

## 2019-02-21 DIAGNOSIS — F10129 Alcohol abuse with intoxication, unspecified: Secondary | ICD-10-CM | POA: Diagnosis present

## 2019-02-21 DIAGNOSIS — Z20828 Contact with and (suspected) exposure to other viral communicable diseases: Secondary | ICD-10-CM | POA: Diagnosis present

## 2019-02-21 DIAGNOSIS — F17210 Nicotine dependence, cigarettes, uncomplicated: Secondary | ICD-10-CM | POA: Diagnosis present

## 2019-02-21 DIAGNOSIS — R402362 Coma scale, best motor response, obeys commands, at arrival to emergency department: Secondary | ICD-10-CM | POA: Diagnosis present

## 2019-02-21 DIAGNOSIS — S92155A Nondisplaced avulsion fracture (chip fracture) of left talus, initial encounter for closed fracture: Secondary | ICD-10-CM

## 2019-02-21 DIAGNOSIS — S32020A Wedge compression fracture of second lumbar vertebra, initial encounter for closed fracture: Secondary | ICD-10-CM | POA: Diagnosis present

## 2019-02-21 DIAGNOSIS — S8255XA Nondisplaced fracture of medial malleolus of left tibia, initial encounter for closed fracture: Secondary | ICD-10-CM

## 2019-02-21 DIAGNOSIS — R402142 Coma scale, eyes open, spontaneous, at arrival to emergency department: Secondary | ICD-10-CM | POA: Diagnosis present

## 2019-02-21 DIAGNOSIS — S12691A Other nondisplaced fracture of seventh cervical vertebra, initial encounter for closed fracture: Secondary | ICD-10-CM | POA: Diagnosis present

## 2019-02-21 DIAGNOSIS — S12591A Other nondisplaced fracture of sixth cervical vertebra, initial encounter for closed fracture: Secondary | ICD-10-CM | POA: Diagnosis present

## 2019-02-21 DIAGNOSIS — R402252 Coma scale, best verbal response, oriented, at arrival to emergency department: Secondary | ICD-10-CM | POA: Diagnosis present

## 2019-02-21 DIAGNOSIS — S92102A Unspecified fracture of left talus, initial encounter for closed fracture: Secondary | ICD-10-CM | POA: Diagnosis present

## 2019-02-21 DIAGNOSIS — S2232XA Fracture of one rib, left side, initial encounter for closed fracture: Secondary | ICD-10-CM | POA: Diagnosis present

## 2019-02-21 DIAGNOSIS — Y92410 Unspecified street and highway as the place of occurrence of the external cause: Secondary | ICD-10-CM | POA: Diagnosis not present

## 2019-02-21 HISTORY — DX: Nondisplaced avulsion fracture (chip fracture) of left talus, initial encounter for closed fracture: S92.155A

## 2019-02-21 HISTORY — DX: Nondisplaced fracture of medial malleolus of left tibia, initial encounter for closed fracture: S82.55XA

## 2019-02-21 LAB — URINALYSIS, ROUTINE W REFLEX MICROSCOPIC
Bacteria, UA: NONE SEEN
Bilirubin Urine: NEGATIVE
Glucose, UA: NEGATIVE mg/dL
Ketones, ur: 20 mg/dL — AB
Leukocytes,Ua: NEGATIVE
Nitrite: NEGATIVE
Protein, ur: 100 mg/dL — AB
Specific Gravity, Urine: >1.046 — ABNORMAL HIGH (ref 1.005–1.030)
pH: 5 (ref 5.0–8.0)

## 2019-02-21 LAB — HCG, QUANTITATIVE, PREGNANCY: hCG, Beta Chain, Quant, S: 5198 m[IU]/mL — ABNORMAL HIGH (ref <5)

## 2019-02-21 LAB — CDS SEROLOGY

## 2019-02-21 LAB — HIV ANTIBODY (ROUTINE TESTING W REFLEX): HIV Screen 4th Generation wRfx: NONREACTIVE

## 2019-02-21 LAB — SARS CORONAVIRUS 2 (TAT 6-24 HRS): SARS Coronavirus 2: NEGATIVE

## 2019-02-21 MED ORDER — HYDROMORPHONE HCL 1 MG/ML IJ SOLN
0.5000 mg | INTRAMUSCULAR | Status: DC | PRN
  Administered 2019-02-23: 0.5 mg via INTRAVENOUS
  Filled 2019-02-21: qty 1

## 2019-02-21 MED ORDER — ACETAMINOPHEN 500 MG PO TABS
1000.0000 mg | ORAL_TABLET | Freq: Once | ORAL | Status: AC
  Administered 2019-02-21: 1000 mg via ORAL
  Filled 2019-02-21: qty 2

## 2019-02-21 MED ORDER — IBUPROFEN 200 MG PO TABS
600.0000 mg | ORAL_TABLET | Freq: Four times a day (QID) | ORAL | Status: DC | PRN
  Administered 2019-02-22 (×2): 600 mg via ORAL
  Filled 2019-02-21 (×3): qty 3

## 2019-02-21 MED ORDER — ONDANSETRON HCL 4 MG/2ML IJ SOLN
4.0000 mg | Freq: Four times a day (QID) | INTRAMUSCULAR | Status: DC | PRN
  Administered 2019-02-21: 14:00:00 4 mg via INTRAVENOUS
  Filled 2019-02-21: qty 2

## 2019-02-21 MED ORDER — TRAMADOL HCL 50 MG PO TABS
50.0000 mg | ORAL_TABLET | Freq: Four times a day (QID) | ORAL | Status: DC | PRN
  Administered 2019-02-21: 15:00:00 50 mg via ORAL
  Filled 2019-02-21: qty 1

## 2019-02-21 MED ORDER — ENOXAPARIN SODIUM 40 MG/0.4ML ~~LOC~~ SOLN
40.0000 mg | SUBCUTANEOUS | Status: DC
  Filled 2019-02-21: qty 0.4

## 2019-02-21 MED ORDER — SODIUM CHLORIDE 0.9 % IV BOLUS
1000.0000 mL | Freq: Once | INTRAVENOUS | Status: AC
  Administered 2019-02-21: 02:00:00 1000 mL via INTRAVENOUS

## 2019-02-21 MED ORDER — IOHEXOL 300 MG/ML  SOLN
100.0000 mL | Freq: Once | INTRAMUSCULAR | Status: AC | PRN
  Administered 2019-02-21: 01:00:00 100 mL via INTRAVENOUS

## 2019-02-21 MED ORDER — LACTATED RINGERS IV SOLN: INTRAVENOUS | Status: DC

## 2019-02-21 MED ORDER — ACETAMINOPHEN 500 MG PO TABS
1000.0000 mg | ORAL_TABLET | Freq: Four times a day (QID) | ORAL | Status: DC
  Administered 2019-02-21 – 2019-02-24 (×12): 1000 mg via ORAL
  Filled 2019-02-21 (×13): qty 2

## 2019-02-21 MED ORDER — DOCUSATE SODIUM 100 MG PO CAPS
200.0000 mg | ORAL_CAPSULE | Freq: Two times a day (BID) | ORAL | Status: DC
  Administered 2019-02-21 – 2019-02-23 (×3): 200 mg via ORAL
  Administered 2019-02-23: 22:00:00 100 mg via ORAL
  Administered 2019-02-24: 10:00:00 200 mg via ORAL
  Filled 2019-02-21 (×6): qty 2

## 2019-02-21 MED ORDER — ONDANSETRON 4 MG PO TBDP
4.0000 mg | ORAL_TABLET | Freq: Four times a day (QID) | ORAL | Status: DC | PRN
  Administered 2019-02-22 (×2): 4 mg via ORAL
  Filled 2019-02-21 (×2): qty 1
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
## 2019-02-21 NOTE — Progress Notes (Signed)
Orthopedic Tech Progress Note Patient Details:  Michelle Woodard 1995/06/13 [id number]  Ortho Devices Type of Ortho Device: Short leg splint Ortho Device/Splint Location: left Ortho Device/Splint Interventions: Application   Post Interventions Patient Tolerated: Well Instructions Provided: Care of device   [place] Pink 02/21/2019, 5:57 PM
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
## 2019-02-21 NOTE — Plan of Care (Signed)
  Problem: Education: Goal: Knowledge of General Education information will improve Description: Including pain rating scale, medication(s)/side effects and non-pharmacologic comfort measures Outcome: Progressing   Problem: Clinical Measurements: Goal: Respiratory complications will improve Outcome: Progressing Note: On room air   Problem: Nutrition: Goal: Adequate nutrition will be maintained Outcome: Progressing   Problem: Coping: Goal: Level of anxiety will decrease Outcome: Progressing   Problem: Elimination: Goal: Will not experience complications related to urinary retention Outcome: Progressing   Problem: Pain Managment: Goal: General experience of comfort will improve Outcome: Progressing Note: Treated once for pain with tramadol - back and neck pain with gave relief    Problem: Safety: Goal: Ability to remain free from injury will improve Outcome: Progressing

## 2019-02-21 NOTE — Progress Notes (Signed)
Orthopedic Tech Progress Note Patient Details:  Michelle Woodard 28-Aug-1995 [id number]  Ortho Devices Type of Ortho Device: CAM walker Ortho Device/Splint Location: LLE Ortho Device/Splint Interventions: Ordered, Application, Adjustment   Post Interventions Patient Tolerated: Fair Instructions Provided: Adjustment of device, Care of device, Poper ambulation with device   [name] N [name] 02/21/2019, 5:28 AM

## 2019-02-21 NOTE — ED Notes (Signed)
Attempted to ambulate the patient, she did not tolerate it. Patient stated she was unable to pull herself up due to pain. When using the nurse to sit up, she stated the pain was not tolerable.

## 2019-02-21 NOTE — ED Notes (Signed)
Patient transported to CT 
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
## 2019-02-21 NOTE — Progress Notes (Signed)
Orthopedic Tech Progress Note Patient Details:  Michelle Woodard 08-09-1995 [id number] [facility] called and order placed. [facility] tech arrived and assisted with brace fitting and placement.  Patient ID: Michelle Woodard, female   DOB: 10-Dec-1995, 23 y.o.   MRN: [id number]   Staci Righter 02/21/2019, 5:29 AM

## 2019-02-21 NOTE — ED Notes (Signed)
(  [phone] [id number] pt's boyfriend Mitzi Hansen wants an update when possible.

## 2019-02-22 ENCOUNTER — Encounter (HOSPITAL_COMMUNITY): Payer: Self-pay

## 2019-02-22 LAB — CBC
HCT: 31.5 % — ABNORMAL LOW (ref 36.0–46.0)
Hemoglobin: 10.7 g/dL — ABNORMAL LOW (ref 12.0–15.0)
MCH: 28.6 pg (ref 26.0–34.0)
MCHC: 34 g/dL (ref 30.0–36.0)
MCV: 84.2 fL (ref 80.0–100.0)
Platelets: 215 10*3/uL (ref 150–400)
RBC: 3.74 MIL/uL — ABNORMAL LOW (ref 3.87–5.11)
RDW: 12.1 % (ref 11.5–15.5)
WBC: 7.3 10*3/uL (ref 4.0–10.5)
nRBC: 0 % (ref 0.0–0.2)

## 2019-02-22 LAB — BASIC METABOLIC PANEL
Anion gap: 9 (ref 5–15)
BUN: 5 mg/dL — ABNORMAL LOW (ref 6–20)
CO2: 24 mmol/L (ref 22–32)
Calcium: 8.8 mg/dL — ABNORMAL LOW (ref 8.9–10.3)
Chloride: 105 mmol/L (ref 98–111)
Creatinine, Ser: 0.47 mg/dL (ref 0.44–1.00)
GFR calc Af Amer: >60 mL/min (ref >60)
GFR calc non Af Amer: >60 mL/min (ref >60)
Glucose, Bld: 112 mg/dL — ABNORMAL HIGH (ref 70–99)
Potassium: 3.2 mmol/L — ABNORMAL LOW (ref 3.5–5.1)
Sodium: 138 mmol/L (ref 135–145)

## 2019-02-22 MED ORDER — TRAMADOL HCL 50 MG PO TABS
50.0000 mg | ORAL_TABLET | Freq: Four times a day (QID) | ORAL | Status: DC | PRN
  Administered 2019-02-22 – 2019-02-23 (×4): 50 mg via ORAL
  Filled 2019-02-22 (×4): qty 1

## 2019-02-22 NOTE — Plan of Care (Signed)
Nauseated -treated with zofran once  Problem: Education: Goal: Knowledge of General Education information will improve Description: Including pain rating scale, medication(s)/side effects and non-pharmacologic comfort measures Outcome: Progressing   Problem: Clinical Measurements: Goal: Will remain free from infection Outcome: Progressing Goal: Respiratory complications will improve Outcome: Progressing   Problem: Activity: Goal: Risk for activity intolerance will decrease Outcome: Progressing Note: Up to chair for a couple hours today, tolerated well   Problem: Nutrition: Goal: Adequate nutrition will be maintained Outcome: Progressing   Problem: Coping: Goal: Level of anxiety will decrease Outcome: Progressing   Problem: Elimination: Goal: Will not experience complications related to urinary retention Outcome: Progressing   Problem: Pain Managment: Goal: General experience of comfort will improve Outcome: Progressing Note: Neck/back pain treated with tramadol and scheduled tylenol today   Problem: Safety: Goal: Ability to remain free from injury will improve Outcome: Progressing
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
## 2019-02-23 NOTE — Progress Notes (Signed)
Orthopedic Tech Progress Note Patient Details:  Edith Groleau [birth year] [id number] Took Tbar offf tlso to make LSO. Patient ID: Kinsey Karch, female   DOB: August 03, 1995, 23 y.o.   MRN: [id number]   Melony Overly T 02/23/2019, 6:19 AM

## 2019-02-23 NOTE — Plan of Care (Signed)
  Problem: Safety: Goal: Ability to remain free from injury will improve Outcome: Progressing   Problem: Pain Managment: Goal: General experience of comfort will improve Outcome: Progressing   Problem: Coping: Goal: Level of anxiety will decrease Outcome: Progressing   
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
## 2019-02-24 ENCOUNTER — Other Ambulatory Visit: Payer: Self-pay | Admitting: Neurological Surgery

## 2019-02-24 MED ORDER — TRAMADOL HCL 50 MG PO TABS: 50.0000 mg | ORAL_TABLET | Freq: Four times a day (QID) | ORAL | 0 refills | Status: DC | PRN

## 2019-02-24 MED ORDER — ACETAMINOPHEN 500 MG PO TABS: 1000.0000 mg | ORAL_TABLET | Freq: Four times a day (QID) | ORAL | 0 refills | Status: DC

## 2019-02-24 MED ORDER — DOCUSATE SODIUM 100 MG PO CAPS: 200.0000 mg | ORAL_CAPSULE | Freq: Two times a day (BID) | ORAL | 0 refills | Status: DC

## 2019-02-24 MED ORDER — IBUPROFEN 600 MG PO TABS: 600.0000 mg | ORAL_TABLET | Freq: Four times a day (QID) | ORAL | 0 refills | Status: DC | PRN

## 2019-02-24 NOTE — Progress Notes (Signed)
[employer] called, patient has rolling walker, mom in room with patient.  All personal belongings have been collected by mom.  Both mom and patient denied any further questions.

## 2019-02-24 NOTE — Discharge Instructions (Signed)
How to Use a Back Brace  A back brace is a form-fitting device that wraps around your trunk to support your lower back, abdomen, and hips. You may need to wear a back brace to relieve back pain or to correct a medical condition related to the back, such as abnormal curvature of the spine (scoliosis). A back brace can maintain or correct the shape of the spine and prevent a spinal problem from getting worse. A back brace can also take pressure off the layers of tissue (disks) between the bones of the spine (vertebrae). You may need a back brace to keep your back and spine in place while you heal from an injury or recover from surgery. Back braces can be either plastic (rigid brace) or soft elastic (dynamic brace).  A rigid brace usually covers both the front and back of the entire upper body.  A soft brace may cover only the lower back and abdomen and may fasten with self-adhesive elastic straps. Your health care provider will recommend the proper brace for your needs and medical condition. What are the risks?  A back brace may not help if you do not wear it as directed by your health care provider. Be sure to wear the brace exactly as instructed in order to prevent further back problems.  Wearing the brace may be uncomfortable at first. You may have trouble sleeping with it on. It may also be hard for you to do certain activities while wearing it.  If the back brace is not worn as instructed, it may cause rubbing and pressure on your skin and may cause sores. How to use a back brace Different types of braces will have different instructions for use. Follow instructions from your health care provider about:  How to put on the brace.  When and how often to wear the brace. In some cases, braces may need to be worn for long stretches of time. For example, a brace may need to be worn for 16-23 hours a day when used for scoliosis.  How to take off the brace.  Any safety tips you should follow when  wearing the brace. This may include: ? Move carefully while wearing the brace. The brace restricts your movement and could lead to additional injuries. ? Use a cane or walker for support if you feel unsteady. ? Sit in high, firm chairs. It may be difficult to stand up from low, soft chairs. ? Check the skin around the brace every day. Tell your health care provider about any concerns. How to care for a back brace  Do not let the back brace get wet. Typically, you will remove the brace for bathing and then put it back on afterward.  If you have a rigid brace, be sure to store it in a safe place when you are not wearing it. This will help to prevent damage.  Clean or wash the back brace with mild soap and water as told by your health care provider. Contact a health care provider if:  Your brace gets damaged.  You have pain or discomfort when wearing the back brace.  Your back pain is getting worse or is not improving over time.  You notice redness or skin breakdown from wearing the brace. Summary  You may need to wear a back brace to relieve back pain or to correct a medical condition related to the back, such as abnormal curvature of the spine (scoliosis).  Follow instructions as told by your health  care provider about how to use the brace and how to take care of it.  A back brace may not help if you do not wear it as directed by your health care provider. Wear the brace exactly as instructed in order to prevent further back problems.  Move carefully while wearing the brace.  Contact a health care provider if you have pain or discomfort when wearing the back brace or your back pain is getting worse or is not improving over time. This information is not intended to replace advice given to you by your health care provider. Make sure you discuss any questions you have with your health care provider. Document Released: 01/31/2011 Document Revised: 01/07/2018 Document Reviewed:  01/07/2018 Elsevier Patient Education  2020 Elsevier Inc.   Cast or Splint Care, Adult Casts and splints are supports that are worn to protect broken bones and other injuries. A cast or splint may hold a bone still and in the correct position while it heals. Casts and splints may also help to ease pain, swelling, and muscle spasms. How to care for your cast   Do not stick anything inside the cast to scratch your skin.  Check the skin around the cast every day. Tell your doctor about any concerns.  You may put lotion on dry skin around the edges of the cast. Do not put lotion on the skin under the cast.  Keep the cast clean.  If the cast is not waterproof: ? Do not let it get wet. ? Cover it with a watertight covering when you take a bath or a shower. How to care for your splint   Wear it as told by your doctor. Take it off only as told by your doctor.  Keep the splint clean.  If the splint is not waterproof: ? Do not let it get wet. ? Cover it with a watertight covering when you take a bath or a shower. Follow these instructions at home: Bathing  Do not take baths or swim until your doctor says it is okay. Ask your doctor if you can take showers. You may only be allowed to take sponge baths for bathing.  If your cast or splint is not waterproof, cover it with a watertight covering when you take a bath or shower. Managing pain, stiffness, and swelling  Move your fingers or toes often to avoid stiffness and to lessen swelling.  Raise (elevate) the injured area above the level of your heart while sitting or lying down. Safety  Do not use the injured limb to support your body weight until your doctor says that it is okay.  Use crutches or other assistive devices as told by your doctor. General instructions  Do not put pressure on any part of the cast or splint until it is fully hardened. This may take many hours.  Return to your normal activities as told by your doctor.  Ask your doctor what activities are safe for you.  Keep all follow-up visits as told by your doctor. This is important. Contact a doctor if:  Your cast or splint gets damaged.  The skin around the cast gets red or raw.  The skin under the cast is very itchy or painful.  Your cast or splint feels very uncomfortable.  Your cast or splint is too tight or too loose.  Your cast becomes wet or it starts to have a soft spot or area.  You get an object stuck under your cast. Get help right  away if:  Your pain gets worse.  The injured area tingles, gets numb, or turns blue and cold.  The part of your body above or below the cast is swollen and it turns a different color (is discolored).  You cannot feel or move your fingers or toes.  There is fluid leaking through the cast.  You have very bad pain or pressure under the cast.  You have trouble breathing.  You have shortness of breath.  You have chest pain. This information is not intended to replace advice given to you by your health care provider. Make sure you discuss any questions you have with your health care provider. Document Released: 06/13/2010 Document Revised: 06/03/2018 Document Reviewed: 02/02/2016 Elsevier Patient Education  2020 Elsevier Inc.  RIB FRACTURES  HOME INSTRUCTIONS   1. PAIN CONTROL:  1. Pain is best controlled by a usual combination of three different methods TOGETHER:  i. Ice/Heat ii. Over the counter pain medication iii. Prescription pain medication 2. You may experience some swelling and bruising in area of broken ribs. Ice packs or heating pads (30-60 minutes up to 6 times a day) will help. Use ice for the first few days to help decrease swelling and bruising, then switch to heat to help relax tight/sore spots and speed recovery. Some people prefer to use ice alone, heat alone, alternating between ice & heat. Experiment to what works for you. Swelling and bruising can take several weeks to  resolve.  3. It is helpful to take an over-the-counter pain medication regularly for the first few weeks. Choose one of the following that works best for you:  i. Naproxen (Aleve, etc) Two 220mg  tabs twice a day ii. Ibuprofen (Advil, etc) Three 200mg  tabs four times a day (every meal & bedtime) iii. Acetaminophen (Tylenol, etc) 500-650mg  four times a day (every meal & bedtime) 4. A prescription for pain medication (such as oxycodone, hydrocodone, etc) may be given to you upon discharge. Take your pain medication as prescribed.  i. If you are having problems/concerns with the prescription medicine (does not control pain, nausea, vomiting, rash, itching, etc), please call [phone] to see if we need to switch you to a different pain medicine that will work better for you and/or control your side effect better. ii. If you need a refill on your pain medication, please contact your pharmacy. They will contact our office to request authorization. Prescriptions will not be filled after 5 pm or on week-ends. 1. Avoid getting constipated. When taking pain medications, it is common to experience some constipation. Increasing fluid intake and taking a fiber supplement (such as Metamucil, Citrucel, FiberCon, MiraLax, etc) 1-2 times a day regularly will usually help prevent this problem from occurring. A mild laxative (prune juice, Milk of Magnesia, MiraLax, etc) should be taken according to package directions if there are no bowel movements after 48 hours.  2. Watch out for diarrhea. If you have many loose bowel movements, simplify your diet to bland foods & liquids for a few days. Stop any stool softeners and decrease your fiber supplement. Switching to mild anti-diarrheal medications (Kayopectate, Pepto Bismol) can help. If this worsens or does not improve, please call us. 3. FOLLOW UP  a. If a follow up appointment is needed one will be scheduled for you. If none is needed with our trauma team, please  follow up with your primary care provider within 2-3 weeks from discharge. Please call [facility] at [phone] if you have any questions about  follow up.  b. If you have any orthopedic or other injuries you will need to follow up as outlined in your follow up instructions.   WHEN TO CALL us [phone]:  1. Poor pain control 2. Reactions / problems with new medications (rash/itching, nausea, etc)  3. Fever over 101.5 F (38.5 C) 4. Worsening swelling or bruising 5. Worsening pain, productive cough, difficulty breathing or any other concerning symptoms  The clinic staff is available to answer your questions during regular business hours (8:30am-5pm). Please don't hesitate to call and ask to speak to one of our nurses for clinical concerns.  If you have a medical emergency, go to the nearest emergency room or call 911.  A surgeon from [facility] is always on call at the [facility], [place], [place], [place],  [phone] ?  MAIN: [phone] ? TOLL FREE: [phone] ?  FAX [phone]  [email or web address]      Information on Rib Fractures  A rib fracture is a break or crack in one of the bones of the ribs. The ribs are long, curved bones that wrap around your chest and attach to your spine and your breastbone. The ribs protect your heart, lungs, and other organs in the chest. A broken or cracked rib is often painful but is not usually serious. Most rib fractures heal on their own over time. However, rib fractures can be more serious if multiple ribs are broken or if broken ribs move out of place and push against other structures or organs. What are the causes? This condition is caused by:  Repetitive movements with high force, such as pitching a baseball or having severe coughing spells.  A direct blow to the chest, such as a sports injury, a car accident, or a fall.  Cancer that has spread to the  bones, which can weaken bones and cause them to break. What are the signs or symptoms? Symptoms of this condition include:  Pain when you breathe in or cough.  Pain when someone presses on the injured area.  Feeling short of breath. How is this diagnosed? This condition is diagnosed with a physical exam and medical history. Imaging tests may also be done, such as:  Chest X-ray.  CT scan.  MRI.  Bone scan.  Chest ultrasound. How is this treated? Treatment for this condition depends on the severity of the fracture. Most rib fractures usually heal on their own in 1-3 months. Sometimes healing takes longer if there is a cough that does not stop or if there are other activities that make the injury worse (aggravating factors). While you heal, you will be given medicines to control the pain. You will also be taught deep breathing exercises. Severe injuries may require hospitalization or surgery. Follow these instructions at home: Managing pain, stiffness, and swelling  If directed, apply ice to the injured area. ? Put ice in a plastic bag. ? Place a towel between your skin and the bag. ? Leave the ice on for 20 minutes, 2-3 times a day.  Take over-the-counter and prescription medicines only as told by your health care provider. Activity  Avoid a lot of activity and any activities or movements that cause pain. Be careful during activities and avoid bumping the injured rib.  Slowly increase your activity as told by your health care provider. General instructions  Do deep breathing exercises as told by your health care provider. This helps prevent pneumonia,  which is a common complication of a broken rib. Your health care provider may instruct you to: ? Take deep breaths several times a day. ? Try to cough several times a day, holding a pillow against the injured area. ? Use a device called incentive spirometer to practice deep breathing several times a day.  Drink enough fluid to  keep your urine pale yellow.  Do not wear a rib belt or binder. These restrict breathing, which can lead to pneumonia.  Keep all follow-up visits as told by your health care provider. This is important. Contact a health care provider if:  You have a fever. Get help right away if:  You have difficulty breathing or you are short of breath.  You develop a cough that does not stop, or you cough up thick or bloody sputum.  You have nausea, vomiting, or pain in your abdomen.  Your pain gets worse and medicine does not help. Summary  A rib fracture is a break or crack in one of the bones of the ribs.  A broken or cracked rib is often painful but is not usually serious.  Most rib fractures heal on their own over time.  Treatment for this condition depends on the severity of the fracture.  Avoid a lot of activity and any activities or movements that cause pain. This information is not intended to replace advice given to you by your health care provider. Make sure you discuss any questions you have with your health care provider. Document Released: 02/11/2005 Document Revised: 05/13/2016 Document Reviewed: 05/13/2016 Elsevier Interactive Patient Education  2019 [employer].
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
## 2019-02-24 NOTE — Progress Notes (Signed)
Was asked by PT [facility] to call Dr. Ronnald Ramp office and clarify brace order.  Michelle Ala, RN @ Dr. Ronnald Ramp office stated that [facility] and TLSO brace should be worn at all times because injury is a compression fracture.  Gave information to [place] PT and number to call if she had any additional questions.
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
## 2019-04-09 DIAGNOSIS — F411 Generalized anxiety disorder: Secondary | ICD-10-CM | POA: Diagnosis not present

## 2019-04-09 DIAGNOSIS — F331 Major depressive disorder, recurrent, moderate: Secondary | ICD-10-CM | POA: Diagnosis not present

## 2019-05-03 DIAGNOSIS — R0981 Nasal congestion: Secondary | ICD-10-CM | POA: Diagnosis not present

## 2019-05-03 DIAGNOSIS — Z1152 Encounter for screening for COVID-19: Secondary | ICD-10-CM | POA: Diagnosis not present

## 2019-05-03 DIAGNOSIS — J039 Acute tonsillitis, unspecified: Secondary | ICD-10-CM | POA: Diagnosis not present

## 2019-05-07 DIAGNOSIS — F411 Generalized anxiety disorder: Secondary | ICD-10-CM | POA: Diagnosis not present

## 2019-05-07 DIAGNOSIS — F331 Major depressive disorder, recurrent, moderate: Secondary | ICD-10-CM | POA: Diagnosis not present

## 2019-05-17 DIAGNOSIS — F419 Anxiety disorder, unspecified: Secondary | ICD-10-CM | POA: Diagnosis not present

## 2019-05-18 DIAGNOSIS — Z30431 Encounter for routine checking of intrauterine contraceptive device: Secondary | ICD-10-CM | POA: Diagnosis not present

## 2019-05-20 DIAGNOSIS — Z30431 Encounter for routine checking of intrauterine contraceptive device: Secondary | ICD-10-CM | POA: Diagnosis not present

## 2019-05-31 DIAGNOSIS — F419 Anxiety disorder, unspecified: Secondary | ICD-10-CM | POA: Diagnosis not present

## 2019-06-08 DIAGNOSIS — F419 Anxiety disorder, unspecified: Secondary | ICD-10-CM | POA: Diagnosis not present

## 2019-06-25 DIAGNOSIS — F419 Anxiety disorder, unspecified: Secondary | ICD-10-CM | POA: Diagnosis not present

## 2019-06-30 DIAGNOSIS — F419 Anxiety disorder, unspecified: Secondary | ICD-10-CM | POA: Diagnosis not present

## 2019-07-14 DIAGNOSIS — F419 Anxiety disorder, unspecified: Secondary | ICD-10-CM | POA: Diagnosis not present

## 2019-07-21 DIAGNOSIS — F419 Anxiety disorder, unspecified: Secondary | ICD-10-CM | POA: Diagnosis not present

## 2019-08-04 DIAGNOSIS — F419 Anxiety disorder, unspecified: Secondary | ICD-10-CM | POA: Diagnosis not present

## 2019-08-18 DIAGNOSIS — F439 Reaction to severe stress, unspecified: Secondary | ICD-10-CM | POA: Diagnosis not present

## 2019-08-25 DIAGNOSIS — F439 Reaction to severe stress, unspecified: Secondary | ICD-10-CM | POA: Diagnosis not present

## 2019-09-01 DIAGNOSIS — F439 Reaction to severe stress, unspecified: Secondary | ICD-10-CM | POA: Diagnosis not present

## 2019-09-08 DIAGNOSIS — F439 Reaction to severe stress, unspecified: Secondary | ICD-10-CM | POA: Diagnosis not present

## 2021-02-25 NOTE — L&D Delivery Note (Signed)
Delivery Note   Pt reached complete dilation and pushed for about 20 minutes.  At 1:54 PM a healthy female was delivered via Vaginal, Spontaneous (Presentation: ROA).  APGAR: 8, 9; weight pending.   Placenta status: Spontaneous, Intact.  Cord: 3 vessels with the following complications: None.    Anesthesia: Epidural Episiotomy: None Lacerations: Perineal Suture Repair: 3.0 vicryl rapide Est. Blood Loss (mL):  168ml  Mom to postpartum.  Baby to Couplet care / Skin to Skin.  Logan Bores 12/14/2021, 2:20 PM

## 2021-05-31 LAB — OB RESULTS CONSOLE HEPATITIS B SURFACE ANTIGEN: Hepatitis B Surface Ag: NEGATIVE

## 2021-05-31 LAB — HEPATITIS C ANTIBODY: HCV Ab: NEGATIVE

## 2021-05-31 LAB — OB RESULTS CONSOLE ANTIBODY SCREEN: Antibody Screen: NEGATIVE

## 2021-05-31 LAB — OB RESULTS CONSOLE RUBELLA ANTIBODY, IGM: Rubella: NON-IMMUNE/NOT IMMUNE

## 2021-05-31 LAB — HM PAP SMEAR

## 2021-05-31 LAB — OB RESULTS CONSOLE ABO/RH: RH Type: POSITIVE

## 2021-05-31 LAB — OB RESULTS CONSOLE RPR: RPR: NONREACTIVE

## 2021-05-31 LAB — OB RESULTS CONSOLE HIV ANTIBODY (ROUTINE TESTING): HIV: NONREACTIVE

## 2021-05-31 LAB — OB RESULTS CONSOLE GC/CHLAMYDIA
Chlamydia: NEGATIVE
Neisseria Gonorrhea: NEGATIVE

## 2021-06-08 ENCOUNTER — Other Ambulatory Visit: Payer: Self-pay

## 2021-06-08 ENCOUNTER — Other Ambulatory Visit: Payer: Self-pay | Admitting: Obstetrics and Gynecology

## 2021-06-08 DIAGNOSIS — Z363 Encounter for antenatal screening for malformations: Secondary | ICD-10-CM

## 2021-06-11 ENCOUNTER — Ambulatory Visit: Payer: Medicaid Other | Attending: Obstetrics and Gynecology

## 2021-06-11 ENCOUNTER — Encounter: Payer: Self-pay | Admitting: *Deleted

## 2021-06-11 ENCOUNTER — Other Ambulatory Visit: Payer: Self-pay | Admitting: *Deleted

## 2021-06-11 ENCOUNTER — Ambulatory Visit (HOSPITAL_BASED_OUTPATIENT_CLINIC_OR_DEPARTMENT_OTHER): Payer: Medicaid Other

## 2021-06-11 ENCOUNTER — Ambulatory Visit: Payer: Medicaid Other | Admitting: *Deleted

## 2021-06-11 DIAGNOSIS — O09292 Supervision of pregnancy with other poor reproductive or obstetric history, second trimester: Secondary | ICD-10-CM

## 2021-06-11 DIAGNOSIS — O3510X0 Maternal care for (suspected) chromosomal abnormality in fetus, unspecified, not applicable or unspecified: Secondary | ICD-10-CM

## 2021-06-11 DIAGNOSIS — Z363 Encounter for antenatal screening for malformations: Secondary | ICD-10-CM | POA: Diagnosis not present

## 2021-06-11 DIAGNOSIS — O285 Abnormal chromosomal and genetic finding on antenatal screening of mother: Secondary | ICD-10-CM | POA: Insufficient documentation

## 2021-06-11 DIAGNOSIS — Z3A32 32 weeks gestation of pregnancy: Secondary | ICD-10-CM | POA: Diagnosis not present

## 2021-06-11 DIAGNOSIS — Z3A12 12 weeks gestation of pregnancy: Secondary | ICD-10-CM

## 2021-06-11 DIAGNOSIS — O289 Unspecified abnormal findings on antenatal screening of mother: Secondary | ICD-10-CM

## 2021-06-11 NOTE — Progress Notes (Addendum)
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   EDC: 12/18/2021 LMP: 03/13/2021 Referring Provider:  Philip Aspen, DO  EGA: [redacted]w[redacted]d Genetic Counselor: Michelle Dunk, MS, CGC  OB Hx: [id number] Date of Appointment: 06/11/2021  Accompanied by: Michelle Woodard Face to Face Time: 40 Minutes   Previous Testing Completed: CBC from 05/31/2021 reviewed. MCV within normal limits. It is unlikely that Michelle Woodard is a beta thalassemia carrier or an alpha thalassemia carrier of the double-gene deletion. Individuals with a normal MCV may be single-gene deletion carriers, but it is unlikely that the current pregnancy would be affected with alpha or beta thalassemia major. Michelle Woodard previously completed carrier screening. She screened to not be a carrier for Cystic Fibrosis (CF), Spinal Muscular Atrophy (SMA), Duchenne/Becker Muscular Dystrophy (DMD), and Fragile X syndrome (33 and 20 CGG repeats in FMR1). A negative result on carrier screening reduces the likelihood of being a carrier, however, does not entirely rule out the possibility.   Medical & Family History:  This is Michelle Woodard's 3rd pregnancy. She has had one ectopic pregnancy and one early elective loss. Reports she takes prenatal vitamins.  Reports she quit smoking when she learned she was pregnant. Denies personal history of diabetes, high blood pressure, thyroid conditions, and seizures. Denies bleeding, infections, and fevers in this pregnancy. Denies using alcohol or street drugs in this pregnancy. Maternal ethnicity reported as White/Caucasian and paternal ethnicity reported as White/Caucasian. Denies consanguinity. Denies [place].  Michelle Woodard denied a family history of chromosome conditions, intellectual disability, autism, birth defects, bone/skeletal disorders, blindness, deafness, blood disorders, neuromuscular disorders, still births, early infant deaths, and 3 or more  pregnancy losses for one person on her prenatal intake questionnaire.    Genetic Counseling:   Atypical Non-Invasive Prenatal Screen Result. Michelle Woodard previously completed Non-Invasive Prenatal Screening (NIPS) in this pregnancy (scanned into Epic under the Media tab). The result is low risk for Down syndrome, Trisomy 27, Trisomy 51, and Triploidy. 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 The finding may also be due to normal variation and/or confined placental mosaicism. The atypical finding is not suspected to be of maternal origin. Fetal risk assessment for monosomy X could not be performed. Genetic counseling reviewed this result in detail with Michelle Woodard and offered her CVS and/or amniocentesis for fetal chromosome analysis and microarray studies. After hearing the information, and careful consideration, Michelle Woodard opted to pursue a amniocentesis for prenatal diagnosis around [redacted] weeks gestation. 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   Birth Defects. All babies have approximately a 3-5% risk for a birth defect and a majority of these defects cannot be detected through the screening or diagnostic testing listed below. Ultrasound may detect some birth defects, but it may not detect all birth defects. About half of pregnancies with Down syndrome do not show any soft markers on ultrasound. A normal ultrasound does not guarantee a healthy pregnancy.    Testing/Screening Options:   CVS/Amniocentesis. These procedures are available for prenatal diagnosis. Possible procedural difficulties and complications that can arise with these procedures include maternal infection, cramping, bleeding, fluid leakage, and/or pregnancy loss. The risk for pregnancy loss with a CVS/amniocentesis is 1/500. Per the [employer] of  Obstetricians and Gynecologists (ACOG) Practice Bulletin 162, all pregnant women should be offered prenatal assessment for  aneuploidy by diagnostic testing regardless of maternal age or other risk factors. If indicated, genetic testing that could be ordered on a CVS or amniocentesis sample includes a fetal karyotype, fetal microarray, and testing for specific syndromes.     Patient Plan:  Proceed with: After hearing the information above, and careful consideration, Michelle Woodard opted to pursue a amniocentesis for prenatal diagnosis around [redacted] weeks gestation. 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  Informed consent was obtained. All questions were answered.  Declined: CVS    Thank you for sharing in the care of Michelle Woodard with [place].  Please do not hesitate to contact us if you have any questions.  Michelle Dunk, MS, [facility]

## 2021-07-09 ENCOUNTER — Ambulatory Visit: Payer: Medicaid Other

## 2021-07-09 ENCOUNTER — Ambulatory Visit: Payer: Medicaid Other | Admitting: *Deleted

## 2021-07-09 ENCOUNTER — Encounter: Payer: Self-pay | Admitting: *Deleted

## 2021-07-09 ENCOUNTER — Ambulatory Visit: Payer: Medicaid Other | Attending: Obstetrics and Gynecology

## 2021-07-09 ENCOUNTER — Other Ambulatory Visit: Payer: Self-pay | Admitting: *Deleted

## 2021-07-09 VITALS — BP 100/72 | HR 97

## 2021-07-09 DIAGNOSIS — Z3A16 16 weeks gestation of pregnancy: Secondary | ICD-10-CM | POA: Diagnosis not present

## 2021-07-09 DIAGNOSIS — O280 Abnormal hematological finding on antenatal screening of mother: Secondary | ICD-10-CM

## 2021-07-09 DIAGNOSIS — Z36 Encounter for antenatal screening for chromosomal anomalies: Secondary | ICD-10-CM | POA: Insufficient documentation

## 2021-07-09 DIAGNOSIS — O285 Abnormal chromosomal and genetic finding on antenatal screening of mother: Secondary | ICD-10-CM | POA: Insufficient documentation

## 2021-07-09 DIAGNOSIS — O3510X0 Maternal care for (suspected) chromosomal abnormality in fetus, unspecified, not applicable or unspecified: Secondary | ICD-10-CM | POA: Diagnosis present

## 2021-07-09 DIAGNOSIS — Q999 Chromosomal abnormality, unspecified: Secondary | ICD-10-CM | POA: Insufficient documentation

## 2021-07-09 DIAGNOSIS — Z362 Encounter for other antenatal screening follow-up: Secondary | ICD-10-CM

## 2021-07-09 DIAGNOSIS — O3515X0 Maternal care for (suspected) chromosomal abnormality in fetus, sex chromosome abnormality, not applicable or unspecified: Secondary | ICD-10-CM | POA: Diagnosis not present

## 2021-07-09 DIAGNOSIS — O289 Unspecified abnormal findings on antenatal screening of mother: Secondary | ICD-10-CM | POA: Diagnosis not present

## 2021-07-09 NOTE — Addendum Note (Signed)
Addended by: Teena Dunk on: 07/09/2021 01:20 PM ? ? Modules accepted: Orders ? ?

## 2021-07-10 ENCOUNTER — Telehealth: Payer: Self-pay | Admitting: *Deleted

## 2021-07-10 NOTE — Telephone Encounter (Signed)
Follow up call made. Left message

## 2021-07-11 ENCOUNTER — Telehealth: Payer: Self-pay | Admitting: Genetics

## 2021-07-11 NOTE — Telephone Encounter (Signed)
Returned normal AF-AFP studies to [place] via telephone. Discussed the karyotype reflex to microarray are still pending. All questions answered.  ?

## 2021-07-17 LAB — MCC TRACKING

## 2021-07-19 ENCOUNTER — Telehealth: Payer: Self-pay | Admitting: Genetics

## 2021-07-19 LAB — MATERNAL CELL CONTAMINATION

## 2021-07-19 NOTE — Telephone Encounter (Signed)
Called Michelle Woodard to return amniocentesis karyotype results. Left voicemail with [facility] call back number.

## 2021-07-20 ENCOUNTER — Telehealth: Payer: Self-pay | Admitting: Genetics

## 2021-07-20 NOTE — Telephone Encounter (Signed)
Returned normal amniocentesis karyotype result to Michelle Woodard. Discussed that the microarray result is pending. All questions answered.

## 2021-07-31 ENCOUNTER — Ambulatory Visit: Payer: Medicaid Other | Attending: Obstetrics and Gynecology

## 2021-07-31 ENCOUNTER — Encounter: Payer: Self-pay | Admitting: Genetics

## 2021-07-31 ENCOUNTER — Ambulatory Visit: Payer: Medicaid Other | Admitting: *Deleted

## 2021-07-31 ENCOUNTER — Ambulatory Visit: Payer: Medicaid Other

## 2021-07-31 ENCOUNTER — Other Ambulatory Visit: Payer: Self-pay | Admitting: *Deleted

## 2021-07-31 ENCOUNTER — Encounter: Payer: Self-pay | Admitting: *Deleted

## 2021-07-31 ENCOUNTER — Ambulatory Visit (HOSPITAL_BASED_OUTPATIENT_CLINIC_OR_DEPARTMENT_OTHER): Payer: Medicaid Other | Admitting: Maternal & Fetal Medicine

## 2021-07-31 VITALS — BP 105/59 | HR 81

## 2021-07-31 DIAGNOSIS — O35AXX0 Maternal care for other (suspected) fetal abnormality and damage, fetal facial anomalies, not applicable or unspecified: Secondary | ICD-10-CM | POA: Diagnosis not present

## 2021-07-31 DIAGNOSIS — O280 Abnormal hematological finding on antenatal screening of mother: Secondary | ICD-10-CM | POA: Diagnosis present

## 2021-07-31 DIAGNOSIS — Z362 Encounter for other antenatal screening follow-up: Secondary | ICD-10-CM | POA: Insufficient documentation

## 2021-07-31 DIAGNOSIS — Z3A20 20 weeks gestation of pregnancy: Secondary | ICD-10-CM

## 2021-07-31 DIAGNOSIS — O3515X0 Maternal care for (suspected) chromosomal abnormality in fetus, sex chromosome abnormality, not applicable or unspecified: Secondary | ICD-10-CM

## 2021-07-31 DIAGNOSIS — O283 Abnormal ultrasonic finding on antenatal screening of mother: Secondary | ICD-10-CM

## 2021-07-31 DIAGNOSIS — O35DXX2 Maternal care for other (suspected) fetal abnormality and damage, fetal gastrointestinal anomalies, fetus 2: Secondary | ICD-10-CM

## 2021-07-31 LAB — CHROMOSOME MICROARRAY REFLEX, AMN FLD

## 2021-07-31 LAB — CHROMOSOME ANALYSIS W REFLEX TO SNP, AMNIOTIC
Cells Analyzed: 15
Cells Counted: 15
Cells Karyotyped: 5
Colonies: 15
GTG Band Resolution Achieved: 450

## 2021-07-31 LAB — AFP, AMNIOTIC FLUID
AFP, Amniotic Fluid (mcg/ml): 17.1 ug/mL
Gestational Age(Wks): 16
MOM, Amniotic Fluid: 1.27

## 2021-07-31 NOTE — Progress Notes (Signed)
Returned normal fetal microarray result to Michelle Woodard in person during her ultrasound appointment on 07/31/21. No further genetic tests are pending.

## 2021-07-31 NOTE — Progress Notes (Signed)
MFM Brief Note  Michelle Woodard is a G3P0 who is here for follow growth at the request of Raynelle Highland, DO.  She is overall doing well without complaints.   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 Normal interval growth with measurements consistent with small for gestational age. Good fetal movement and amniotic fluid volume  Suboptimal views of the fetal hand was again poorly observed today as well as the nasal bone and profile. These structures were limted because of fetal position.  We observed a finding of echogenic bowel  Echogenic abdominal lesions are areas of abnormal brightness in the fetal abdomen, with echogenicity similar to that of surrounding bone, and producing various amounts of acoustic shadowing. Echogenic bowel (approximately 1% of second-trimester pregnancies) results from excessively thick meconium caused by an aperistaltic small bowel. Swallowed blood can also produce an echogenic appearance. Echogenic bowel is associated with aneuploidy, TORCH (toxoplasmosis, other [e.g., syphilis, parvovirus B19, varicella], rubella, cytomegalovirus [CMV], herpesvirus) infection, meconium ileus, cystic fibrosis, placental hemorrhage, ischemia, or can be a normal variant. Approximately 50% of fetuses with echogenic bowel have other structural abnormalities or fetal growth restriction (or both). Aneuploidy (trisomy 13, 18, 21, triploidy) is present in up to 10%.; Cystic fibrosis is present in 13%-40% of patients with meconium ileus and TORCH infections are present in 10%.   Approximately 50% of cases of isolated echogenic bowel resolve spontaneously prenatally.   At this time we recommend serum serologies for toxoplasmosis and CMV and serial ultrasounds for fetal growth, amniotic fluid and worsening fetal condition (ie hydrops fetalis). Prior CF , amniocentesis etc was normal. Assuming no chromosome defects, cystic fibrosis, infection, growth  restriction, or other associated anatomic abnormalities, the prognosis is good for normal outcome.  I spent 30 minute with > 50% in face to face consultation.   All questions answered.  Michelle Olive, MD.

## 2021-08-01 ENCOUNTER — Other Ambulatory Visit: Payer: Self-pay

## 2021-08-01 LAB — TOXOPLASMA GONDII ANTIBODY, IGM: Toxoplasma Antibody- IgM: <3 AU/mL (ref 0.0–7.9)

## 2021-08-01 LAB — CMV IGM: CMV IgM Ser EIA-aCnc: <30 AU/mL (ref 0.0–29.9)

## 2021-08-01 LAB — PARVOVIRUS B19 ANTIBODY, IGG AND IGM
Parvovirus B19 IgG: 6 index — ABNORMAL HIGH (ref 0.0–0.8)
Parvovirus B19 IgM: 0.2 index (ref 0.0–0.8)

## 2021-08-01 LAB — INFECT DISEASE AB IGM REFLEX 1

## 2021-08-01 LAB — TOXOPLASMA GONDII ANTIBODY, IGG: Toxoplasma IgG Ratio: <3 IU/mL (ref 0.0–7.1)

## 2021-08-01 LAB — CMV ANTIBODY, IGG (EIA): CMV Ab - IgG: 0.69 U/mL — ABNORMAL HIGH (ref 0.00–0.59)

## 2021-08-07 LAB — CYTOMEGALOVIRUS IGG AVIDITY: CMV IgG Avidity Index: 0.76 Index

## 2021-08-07 LAB — SPECIMEN STATUS REPORT

## 2021-08-29 ENCOUNTER — Ambulatory Visit: Payer: Medicaid Other | Admitting: *Deleted

## 2021-08-29 ENCOUNTER — Encounter: Payer: Self-pay | Admitting: *Deleted

## 2021-08-29 ENCOUNTER — Ambulatory Visit: Payer: Medicaid Other | Attending: Maternal & Fetal Medicine

## 2021-08-29 ENCOUNTER — Other Ambulatory Visit: Payer: Self-pay | Admitting: *Deleted

## 2021-08-29 VITALS — BP 106/64 | HR 105

## 2021-08-29 DIAGNOSIS — O35DXX2 Maternal care for other (suspected) fetal abnormality and damage, fetal gastrointestinal anomalies, fetus 2: Secondary | ICD-10-CM | POA: Insufficient documentation

## 2021-08-29 DIAGNOSIS — O3515X0 Maternal care for (suspected) chromosomal abnormality in fetus, sex chromosome abnormality, not applicable or unspecified: Secondary | ICD-10-CM | POA: Diagnosis not present

## 2021-08-29 DIAGNOSIS — O283 Abnormal ultrasonic finding on antenatal screening of mother: Secondary | ICD-10-CM | POA: Insufficient documentation

## 2021-08-29 DIAGNOSIS — Z3A24 24 weeks gestation of pregnancy: Secondary | ICD-10-CM | POA: Diagnosis not present

## 2021-09-24 ENCOUNTER — Other Ambulatory Visit: Payer: Self-pay | Admitting: *Deleted

## 2021-09-24 ENCOUNTER — Ambulatory Visit: Payer: Medicaid Other | Attending: Obstetrics

## 2021-09-24 ENCOUNTER — Ambulatory Visit: Payer: Medicaid Other | Admitting: *Deleted

## 2021-09-24 ENCOUNTER — Encounter: Payer: Self-pay | Admitting: *Deleted

## 2021-09-24 ENCOUNTER — Other Ambulatory Visit: Payer: Self-pay | Admitting: Obstetrics

## 2021-09-24 VITALS — BP 109/71 | HR 97

## 2021-09-24 DIAGNOSIS — O3510X0 Maternal care for (suspected) chromosomal abnormality in fetus, unspecified, not applicable or unspecified: Secondary | ICD-10-CM

## 2021-09-24 DIAGNOSIS — Z3689 Encounter for other specified antenatal screening: Secondary | ICD-10-CM | POA: Insufficient documentation

## 2021-09-24 DIAGNOSIS — O283 Abnormal ultrasonic finding on antenatal screening of mother: Secondary | ICD-10-CM

## 2021-09-24 DIAGNOSIS — O365920 Maternal care for other known or suspected poor fetal growth, second trimester, not applicable or unspecified: Secondary | ICD-10-CM

## 2021-09-24 DIAGNOSIS — Z3A27 27 weeks gestation of pregnancy: Secondary | ICD-10-CM | POA: Diagnosis not present

## 2021-10-01 ENCOUNTER — Ambulatory Visit: Payer: Medicaid Other | Attending: Obstetrics

## 2021-10-01 ENCOUNTER — Ambulatory Visit (HOSPITAL_BASED_OUTPATIENT_CLINIC_OR_DEPARTMENT_OTHER): Payer: Medicaid Other | Admitting: *Deleted

## 2021-10-01 ENCOUNTER — Ambulatory Visit: Payer: Medicaid Other | Admitting: *Deleted

## 2021-10-01 VITALS — BP 101/67 | HR 83

## 2021-10-01 DIAGNOSIS — Z3A28 28 weeks gestation of pregnancy: Secondary | ICD-10-CM | POA: Diagnosis present

## 2021-10-01 DIAGNOSIS — O3519X0 Maternal care for (suspected) chromosomal abnormality in fetus, other chromosomal abnormality, not applicable or unspecified: Secondary | ICD-10-CM

## 2021-10-01 DIAGNOSIS — O365930 Maternal care for other known or suspected poor fetal growth, third trimester, not applicable or unspecified: Secondary | ICD-10-CM

## 2021-10-01 DIAGNOSIS — O365920 Maternal care for other known or suspected poor fetal growth, second trimester, not applicable or unspecified: Secondary | ICD-10-CM | POA: Diagnosis present

## 2021-10-01 NOTE — Procedures (Signed)
Candas Deemer 24-Jun-1995 [redacted]w[redacted]d  Fetus A Non-Stress Test Interpretation for 10/01/21  Indication: IUGR  Fetal Heart Rate A Mode: External Baseline Rate (A): 145 bpm Variability: Moderate Accelerations: 10 x 10 Decelerations: None Multiple birth?: No  Uterine Activity Mode: Palpation, Toco Contraction Frequency (min): None  Interpretation (Fetal Testing) Nonstress Test Interpretation: Reactive Comments: Dr. Parke Poisson reviewed tracing.

## 2021-10-09 ENCOUNTER — Encounter: Payer: Self-pay | Admitting: *Deleted

## 2021-10-09 ENCOUNTER — Ambulatory Visit (HOSPITAL_BASED_OUTPATIENT_CLINIC_OR_DEPARTMENT_OTHER): Payer: Medicaid Other | Admitting: *Deleted

## 2021-10-09 ENCOUNTER — Ambulatory Visit: Payer: Medicaid Other | Admitting: *Deleted

## 2021-10-09 ENCOUNTER — Ambulatory Visit: Payer: Medicaid Other | Attending: Obstetrics

## 2021-10-09 VITALS — BP 102/61 | HR 80

## 2021-10-09 DIAGNOSIS — Z3A30 30 weeks gestation of pregnancy: Secondary | ICD-10-CM | POA: Insufficient documentation

## 2021-10-09 DIAGNOSIS — O365930 Maternal care for other known or suspected poor fetal growth, third trimester, not applicable or unspecified: Secondary | ICD-10-CM | POA: Insufficient documentation

## 2021-10-09 DIAGNOSIS — O365920 Maternal care for other known or suspected poor fetal growth, second trimester, not applicable or unspecified: Secondary | ICD-10-CM | POA: Diagnosis not present

## 2021-10-09 DIAGNOSIS — O3515X0 Maternal care for (suspected) chromosomal abnormality in fetus, sex chromosome abnormality, not applicable or unspecified: Secondary | ICD-10-CM | POA: Diagnosis not present

## 2021-10-09 NOTE — Procedures (Signed)
Michelle Woodard [birth year] [redacted]w[redacted]d  Fetus A Non-Stress Test Interpretation for 10/09/21  Indication: IUGR  Fetal Heart Rate A Mode: External Baseline Rate (A): 150 bpm Variability: Moderate Accelerations: 10 x 10 Decelerations: None Multiple birth?: No  Uterine Activity Mode: Palpation, Toco Contraction Frequency (min): UI Contraction Quality: Mild Resting Tone Palpated: Relaxed Resting Time: Adequate  Interpretation (Fetal Testing) Nonstress Test Interpretation: Reactive Comments: Dr. Parke Poisson reviewed tracing.

## 2021-10-16 ENCOUNTER — Encounter: Payer: Self-pay | Admitting: *Deleted

## 2021-10-16 ENCOUNTER — Other Ambulatory Visit: Payer: Self-pay | Admitting: *Deleted

## 2021-10-16 ENCOUNTER — Ambulatory Visit: Payer: Medicaid Other | Attending: Obstetrics

## 2021-10-16 ENCOUNTER — Ambulatory Visit: Payer: Medicaid Other | Admitting: *Deleted

## 2021-10-16 ENCOUNTER — Ambulatory Visit (HOSPITAL_BASED_OUTPATIENT_CLINIC_OR_DEPARTMENT_OTHER): Payer: Medicaid Other | Admitting: *Deleted

## 2021-10-16 VITALS — BP 107/66 | HR 71

## 2021-10-16 DIAGNOSIS — O365931 Maternal care for other known or suspected poor fetal growth, third trimester, fetus 1: Secondary | ICD-10-CM | POA: Insufficient documentation

## 2021-10-16 DIAGNOSIS — Z3A31 31 weeks gestation of pregnancy: Secondary | ICD-10-CM

## 2021-10-16 DIAGNOSIS — O365930 Maternal care for other known or suspected poor fetal growth, third trimester, not applicable or unspecified: Secondary | ICD-10-CM | POA: Insufficient documentation

## 2021-10-16 DIAGNOSIS — O365920 Maternal care for other known or suspected poor fetal growth, second trimester, not applicable or unspecified: Secondary | ICD-10-CM

## 2021-10-16 DIAGNOSIS — O3515X0 Maternal care for (suspected) chromosomal abnormality in fetus, sex chromosome abnormality, not applicable or unspecified: Secondary | ICD-10-CM

## 2021-10-16 NOTE — Procedures (Signed)
Michelle Woodard 1995-11-02 [redacted]w[redacted]d  Fetus A Non-Stress Test Interpretation for 10/16/21  Indication: IUGR  Fetal Heart Rate A Mode: External Baseline Rate (A): 145 bpm Variability: Moderate Accelerations: 15 x 15 Decelerations: None Multiple birth?: No  Uterine Activity Mode: Toco Contraction Frequency (min): 1 uc in 20 min Contraction Duration (sec): 60 Contraction Quality: Mild (not felt by mother) Resting Tone Palpated: Relaxed  Interpretation (Fetal Testing) Nonstress Test Interpretation: Reactive Overall Impression: Reassuring for gestational age Comments: tracing reviewed by Dr. Parke Poisson

## 2021-10-23 ENCOUNTER — Ambulatory Visit: Payer: Medicaid Other | Attending: Obstetrics

## 2021-10-23 ENCOUNTER — Ambulatory Visit: Payer: Medicaid Other | Admitting: *Deleted

## 2021-10-23 DIAGNOSIS — O3515X0 Maternal care for (suspected) chromosomal abnormality in fetus, sex chromosome abnormality, not applicable or unspecified: Secondary | ICD-10-CM

## 2021-10-23 DIAGNOSIS — O365920 Maternal care for other known or suspected poor fetal growth, second trimester, not applicable or unspecified: Secondary | ICD-10-CM | POA: Diagnosis present

## 2021-10-23 DIAGNOSIS — Z3A32 32 weeks gestation of pregnancy: Secondary | ICD-10-CM | POA: Diagnosis not present

## 2021-10-23 DIAGNOSIS — O365930 Maternal care for other known or suspected poor fetal growth, third trimester, not applicable or unspecified: Secondary | ICD-10-CM

## 2021-10-30 ENCOUNTER — Ambulatory Visit: Payer: Medicaid Other

## 2021-10-31 ENCOUNTER — Other Ambulatory Visit: Payer: Self-pay | Admitting: *Deleted

## 2021-10-31 ENCOUNTER — Other Ambulatory Visit: Payer: Self-pay | Admitting: Obstetrics

## 2021-10-31 ENCOUNTER — Ambulatory Visit: Payer: Medicaid Other | Admitting: *Deleted

## 2021-10-31 ENCOUNTER — Ambulatory Visit: Payer: Medicaid Other | Attending: Obstetrics

## 2021-10-31 VITALS — BP 105/69 | HR 73

## 2021-10-31 DIAGNOSIS — O3515X0 Maternal care for (suspected) chromosomal abnormality in fetus, sex chromosome abnormality, not applicable or unspecified: Secondary | ICD-10-CM

## 2021-10-31 DIAGNOSIS — O365930 Maternal care for other known or suspected poor fetal growth, third trimester, not applicable or unspecified: Secondary | ICD-10-CM | POA: Insufficient documentation

## 2021-10-31 DIAGNOSIS — Z3A33 33 weeks gestation of pregnancy: Secondary | ICD-10-CM | POA: Diagnosis not present

## 2021-10-31 DIAGNOSIS — O365931 Maternal care for other known or suspected poor fetal growth, third trimester, fetus 1: Secondary | ICD-10-CM

## 2021-11-06 ENCOUNTER — Ambulatory Visit: Payer: Medicaid Other | Admitting: *Deleted

## 2021-11-06 ENCOUNTER — Ambulatory Visit: Payer: Medicaid Other | Attending: Obstetrics

## 2021-11-06 ENCOUNTER — Encounter: Payer: Self-pay | Admitting: *Deleted

## 2021-11-06 DIAGNOSIS — O365931 Maternal care for other known or suspected poor fetal growth, third trimester, fetus 1: Secondary | ICD-10-CM | POA: Insufficient documentation

## 2021-11-06 DIAGNOSIS — O3515X0 Maternal care for (suspected) chromosomal abnormality in fetus, sex chromosome abnormality, not applicable or unspecified: Secondary | ICD-10-CM

## 2021-11-06 DIAGNOSIS — O365930 Maternal care for other known or suspected poor fetal growth, third trimester, not applicable or unspecified: Secondary | ICD-10-CM | POA: Diagnosis not present

## 2021-11-06 DIAGNOSIS — Z3A34 34 weeks gestation of pregnancy: Secondary | ICD-10-CM | POA: Diagnosis not present

## 2021-11-20 ENCOUNTER — Ambulatory Visit: Payer: Medicaid Other | Admitting: *Deleted

## 2021-11-20 ENCOUNTER — Ambulatory Visit: Payer: Medicaid Other | Attending: Obstetrics

## 2021-11-20 VITALS — BP 111/76 | HR 74

## 2021-11-20 DIAGNOSIS — O365930 Maternal care for other known or suspected poor fetal growth, third trimester, not applicable or unspecified: Secondary | ICD-10-CM

## 2021-11-20 DIAGNOSIS — O3515X0 Maternal care for (suspected) chromosomal abnormality in fetus, sex chromosome abnormality, not applicable or unspecified: Secondary | ICD-10-CM

## 2021-11-20 DIAGNOSIS — O365931 Maternal care for other known or suspected poor fetal growth, third trimester, fetus 1: Secondary | ICD-10-CM | POA: Insufficient documentation

## 2021-11-20 DIAGNOSIS — Z3A36 36 weeks gestation of pregnancy: Secondary | ICD-10-CM | POA: Diagnosis not present

## 2021-11-26 ENCOUNTER — Other Ambulatory Visit: Payer: Self-pay | Admitting: *Deleted

## 2021-11-26 ENCOUNTER — Ambulatory Visit: Payer: Medicaid Other | Attending: Obstetrics

## 2021-11-26 ENCOUNTER — Ambulatory Visit: Payer: Medicaid Other | Admitting: *Deleted

## 2021-11-26 VITALS — BP 102/70 | HR 80

## 2021-11-26 DIAGNOSIS — O365930 Maternal care for other known or suspected poor fetal growth, third trimester, not applicable or unspecified: Secondary | ICD-10-CM | POA: Diagnosis present

## 2021-11-26 DIAGNOSIS — O365990 Maternal care for other known or suspected poor fetal growth, unspecified trimester, not applicable or unspecified: Secondary | ICD-10-CM

## 2021-12-04 ENCOUNTER — Telehealth (HOSPITAL_COMMUNITY): Payer: Self-pay | Admitting: *Deleted

## 2021-12-04 ENCOUNTER — Ambulatory Visit: Payer: Medicaid Other | Attending: Obstetrics

## 2021-12-04 ENCOUNTER — Ambulatory Visit: Payer: Medicaid Other | Admitting: *Deleted

## 2021-12-04 VITALS — BP 105/71 | HR 89

## 2021-12-04 DIAGNOSIS — O365931 Maternal care for other known or suspected poor fetal growth, third trimester, fetus 1: Secondary | ICD-10-CM | POA: Insufficient documentation

## 2021-12-04 DIAGNOSIS — O3515X0 Maternal care for (suspected) chromosomal abnormality in fetus, sex chromosome abnormality, not applicable or unspecified: Secondary | ICD-10-CM

## 2021-12-04 DIAGNOSIS — O365930 Maternal care for other known or suspected poor fetal growth, third trimester, not applicable or unspecified: Secondary | ICD-10-CM | POA: Insufficient documentation

## 2021-12-04 DIAGNOSIS — O365990 Maternal care for other known or suspected poor fetal growth, unspecified trimester, not applicable or unspecified: Secondary | ICD-10-CM | POA: Diagnosis present

## 2021-12-04 DIAGNOSIS — Z3A35 35 weeks gestation of pregnancy: Secondary | ICD-10-CM | POA: Diagnosis not present

## 2021-12-04 NOTE — Telephone Encounter (Signed)
Preadmission screen  

## 2021-12-05 ENCOUNTER — Encounter (HOSPITAL_COMMUNITY): Payer: Self-pay

## 2021-12-06 ENCOUNTER — Telehealth (HOSPITAL_COMMUNITY): Payer: Self-pay | Admitting: *Deleted

## 2021-12-06 ENCOUNTER — Encounter (HOSPITAL_COMMUNITY): Payer: Self-pay | Admitting: *Deleted

## 2021-12-06 NOTE — Telephone Encounter (Signed)
Preadmission screen  

## 2021-12-13 ENCOUNTER — Inpatient Hospital Stay (HOSPITAL_COMMUNITY)
Admission: AD | Admit: 2021-12-13 | Discharge: 2021-12-15 | DRG: 807 | Disposition: A | Discharge status: Home / Self Care | Payer: Medicaid Other | Attending: Obstetrics and Gynecology | Admitting: Obstetrics and Gynecology

## 2021-12-13 ENCOUNTER — Inpatient Hospital Stay (HOSPITAL_COMMUNITY): Payer: Medicaid Other

## 2021-12-13 ENCOUNTER — Encounter (HOSPITAL_COMMUNITY): Payer: Self-pay | Admitting: Obstetrics and Gynecology

## 2021-12-13 ENCOUNTER — Other Ambulatory Visit: Payer: Self-pay

## 2021-12-13 DIAGNOSIS — Z87891 Personal history of nicotine dependence: Secondary | ICD-10-CM

## 2021-12-13 DIAGNOSIS — Z3A39 39 weeks gestation of pregnancy: Secondary | ICD-10-CM | POA: Diagnosis not present

## 2021-12-13 DIAGNOSIS — O99824 Streptococcus B carrier state complicating childbirth: Secondary | ICD-10-CM | POA: Diagnosis present

## 2021-12-13 DIAGNOSIS — O365930 Maternal care for other known or suspected poor fetal growth, third trimester, not applicable or unspecified: Principal | ICD-10-CM | POA: Diagnosis present

## 2021-12-13 MED ORDER — OXYTOCIN-SODIUM CHLORIDE 30-0.9 UT/500ML-% IV SOLN
2.5000 [IU]/h | INTRAVENOUS | Status: DC
  Administered 2021-12-14: 2.5 [IU]/h via INTRAVENOUS
  Filled 2021-12-13: qty 500

## 2021-12-13 MED ORDER — MISOPROSTOL 25 MCG QUARTER TABLET
25.0000 ug | ORAL_TABLET | ORAL | Status: DC | PRN
  Administered 2021-12-13: 25 ug via VAGINAL
  Filled 2021-12-13: qty 1

## 2021-12-13 MED ORDER — LACTATED RINGERS IV SOLN: INTRAVENOUS | Status: DC

## 2021-12-13 MED ORDER — OXYTOCIN BOLUS FROM INFUSION
333.0000 mL | Freq: Once | INTRAVENOUS | Status: AC
  Administered 2021-12-14: 333 mL via INTRAVENOUS

## 2021-12-13 MED ORDER — TERBUTALINE SULFATE 1 MG/ML IJ SOLN: 0.2500 mg | Freq: Once | INTRAMUSCULAR | Status: DC | PRN

## 2021-12-13 MED ORDER — PENICILLIN G POT IN DEXTROSE 60000 UNIT/ML IV SOLN
3.0000 10*6.[IU] | INTRAVENOUS | Status: DC
  Administered 2021-12-14 (×2): 3 10*6.[IU] via INTRAVENOUS
  Filled 2021-12-13 (×3): qty 50

## 2021-12-13 MED ORDER — OXYCODONE-ACETAMINOPHEN 5-325 MG PO TABS: 1.0000 | ORAL_TABLET | ORAL | Status: DC | PRN

## 2021-12-13 MED ORDER — SODIUM CHLORIDE 0.9 % IV SOLN
5.0000 10*6.[IU] | Freq: Once | INTRAVENOUS | Status: AC
  Administered 2021-12-14: 5 10*6.[IU] via INTRAVENOUS
  Filled 2021-12-13: qty 5

## 2021-12-13 MED ORDER — OXYCODONE-ACETAMINOPHEN 5-325 MG PO TABS: 2.0000 | ORAL_TABLET | ORAL | Status: DC | PRN

## 2021-12-13 MED ORDER — ACETAMINOPHEN 325 MG PO TABS: 650.0000 mg | ORAL_TABLET | ORAL | Status: DC | PRN

## 2021-12-13 MED ORDER — FENTANYL CITRATE (PF) 100 MCG/2ML IJ SOLN: 50.0000 ug | INTRAMUSCULAR | Status: DC | PRN

## 2021-12-13 MED ORDER — LACTATED RINGERS IV SOLN: 500.0000 mL | INTRAVENOUS | Status: DC | PRN

## 2021-12-13 MED ORDER — LIDOCAINE HCL (PF) 1 % IJ SOLN: 30.0000 mL | INTRAMUSCULAR | Status: DC | PRN

## 2021-12-13 MED ORDER — SOD CITRATE-CITRIC ACID 500-334 MG/5ML PO SOLN: 30.0000 mL | ORAL | Status: DC | PRN

## 2021-12-13 MED ORDER — ONDANSETRON HCL 4 MG/2ML IJ SOLN
4.0000 mg | Freq: Four times a day (QID) | INTRAMUSCULAR | Status: DC | PRN
  Administered 2021-12-14: 4 mg via INTRAVENOUS
  Filled 2021-12-13: qty 2

## 2021-12-14 ENCOUNTER — Encounter (HOSPITAL_COMMUNITY): Payer: Self-pay | Admitting: Obstetrics and Gynecology

## 2021-12-14 ENCOUNTER — Inpatient Hospital Stay (HOSPITAL_COMMUNITY): Payer: Medicaid Other | Admitting: Anesthesiology

## 2021-12-14 LAB — TYPE AND SCREEN
ABO/RH(D): O POS
Antibody Screen: NEGATIVE

## 2021-12-14 LAB — CBC
HCT: 33.5 % — ABNORMAL LOW (ref 36.0–46.0)
Hemoglobin: 11.2 g/dL — ABNORMAL LOW (ref 12.0–15.0)
MCH: 26 pg (ref 26.0–34.0)
MCHC: 33.4 g/dL (ref 30.0–36.0)
MCV: 77.9 fL — ABNORMAL LOW (ref 80.0–100.0)
Platelets: 263 10*3/uL (ref 150–400)
RBC: 4.3 MIL/uL (ref 3.87–5.11)
RDW: 13.5 % (ref 11.5–15.5)
WBC: 9.6 10*3/uL (ref 4.0–10.5)
nRBC: 0 % (ref 0.0–0.2)

## 2021-12-14 LAB — RPR: RPR Ser Ql: NONREACTIVE

## 2021-12-14 MED ORDER — PHENYLEPHRINE 80 MCG/ML (10ML) SYRINGE FOR IV PUSH (FOR BLOOD PRESSURE SUPPORT): 80.0000 ug | PREFILLED_SYRINGE | INTRAVENOUS | Status: DC | PRN

## 2021-12-14 MED ORDER — LACTATED RINGERS IV SOLN: 500.0000 mL | Freq: Once | INTRAVENOUS | Status: DC

## 2021-12-14 MED ORDER — PRENATAL MULTIVITAMIN CH: 1.0000 | ORAL_TABLET | Freq: Every day | ORAL | Status: DC

## 2021-12-14 MED ORDER — ZOLPIDEM TARTRATE 5 MG PO TABS: 5.0000 mg | ORAL_TABLET | Freq: Every evening | ORAL | Status: DC | PRN

## 2021-12-14 MED ORDER — TETANUS-DIPHTH-ACELL PERTUSSIS 5-2.5-18.5 LF-MCG/0.5 IM SUSY: 0.5000 mL | PREFILLED_SYRINGE | Freq: Once | INTRAMUSCULAR | Status: DC

## 2021-12-14 MED ORDER — TERBUTALINE SULFATE 1 MG/ML IJ SOLN: 0.2500 mg | Freq: Once | INTRAMUSCULAR | Status: DC | PRN

## 2021-12-14 MED ORDER — ACETAMINOPHEN 325 MG PO TABS
650.0000 mg | ORAL_TABLET | ORAL | Status: DC | PRN
  Administered 2021-12-15: 650 mg via ORAL
  Filled 2021-12-14: qty 2

## 2021-12-14 MED ORDER — FENTANYL-BUPIVACAINE-NACL 0.5-0.125-0.9 MG/250ML-% EP SOLN
12.0000 mL/h | EPIDURAL | Status: DC | PRN
  Administered 2021-12-14: 12 mL/h via EPIDURAL
  Filled 2021-12-14: qty 250

## 2021-12-14 MED ORDER — FENTANYL CITRATE (PF) 100 MCG/2ML IJ SOLN: 100.0000 ug | INTRAMUSCULAR | Status: DC | PRN

## 2021-12-14 MED ORDER — LIDOCAINE HCL (PF) 1 % IJ SOLN
INTRAMUSCULAR | Status: DC | PRN
  Administered 2021-12-14 (×2): 4 mL via EPIDURAL

## 2021-12-14 MED ORDER — DIPHENHYDRAMINE HCL 50 MG/ML IJ SOLN: 12.5000 mg | INTRAMUSCULAR | Status: DC | PRN

## 2021-12-14 MED ORDER — BENZOCAINE-MENTHOL 20-0.5 % EX AERO
1.0000 | INHALATION_SPRAY | CUTANEOUS | Status: DC | PRN
  Administered 2021-12-14: 1 via TOPICAL
  Filled 2021-12-14: qty 56

## 2021-12-14 MED ORDER — EPHEDRINE 5 MG/ML INJ: 10.0000 mg | INTRAVENOUS | Status: DC | PRN

## 2021-12-14 MED ORDER — WITCH HAZEL-GLYCERIN EX PADS: 1.0000 | MEDICATED_PAD | CUTANEOUS | Status: DC | PRN

## 2021-12-14 MED ORDER — DIBUCAINE (PERIANAL) 1 % EX OINT: 1.0000 | TOPICAL_OINTMENT | CUTANEOUS | Status: DC | PRN

## 2021-12-14 MED ORDER — SIMETHICONE 80 MG PO CHEW: 80.0000 mg | CHEWABLE_TABLET | ORAL | Status: DC | PRN

## 2021-12-14 MED ORDER — OXYTOCIN-SODIUM CHLORIDE 30-0.9 UT/500ML-% IV SOLN
1.0000 m[IU]/min | INTRAVENOUS | Status: DC
  Administered 2021-12-14: 2 m[IU]/min via INTRAVENOUS

## 2021-12-14 MED ORDER — ONDANSETRON HCL 4 MG PO TABS: 4.0000 mg | ORAL_TABLET | ORAL | Status: DC | PRN

## 2021-12-14 MED ORDER — PHENYLEPHRINE 80 MCG/ML (10ML) SYRINGE FOR IV PUSH (FOR BLOOD PRESSURE SUPPORT)
80.0000 ug | PREFILLED_SYRINGE | INTRAVENOUS | Status: DC | PRN
  Filled 2021-12-14: qty 10

## 2021-12-14 MED ORDER — ONDANSETRON HCL 4 MG/2ML IJ SOLN: 4.0000 mg | INTRAMUSCULAR | Status: DC | PRN

## 2021-12-14 MED ORDER — DIPHENHYDRAMINE HCL 25 MG PO CAPS: 25.0000 mg | ORAL_CAPSULE | Freq: Four times a day (QID) | ORAL | Status: DC | PRN

## 2021-12-14 MED ORDER — SENNOSIDES-DOCUSATE SODIUM 8.6-50 MG PO TABS
2.0000 | ORAL_TABLET | ORAL | Status: DC
  Administered 2021-12-15: 2 via ORAL
  Filled 2021-12-14: qty 2

## 2021-12-14 MED ORDER — IBUPROFEN 600 MG PO TABS
600.0000 mg | ORAL_TABLET | Freq: Four times a day (QID) | ORAL | Status: DC
  Administered 2021-12-14 – 2021-12-15 (×4): 600 mg via ORAL
  Filled 2021-12-14 (×4): qty 1

## 2021-12-14 MED ORDER — COCONUT OIL OIL: 1.0000 | TOPICAL_OIL | Status: DC | PRN

## 2021-12-14 NOTE — Anesthesia Preprocedure Evaluation (Signed)
Anesthesia Evaluation  Patient identified by MRN, date of birth, ID band Patient awake    Reviewed: Allergy & Precautions, Patient's Chart, lab work & pertinent test results  History of Anesthesia Complications Negative for: history of anesthetic complications  Airway Mallampati: II  TM Distance: >3 FB Neck ROM: Full    Dental no notable dental hx.    Pulmonary former smoker,    Pulmonary exam normal        Cardiovascular negative cardio ROS Normal cardiovascular exam     Neuro/Psych negative neurological ROS  negative psych ROS   GI/Hepatic negative GI ROS, Neg liver ROS,   Endo/Other  negative endocrine ROS  Renal/GU negative Renal ROS  negative genitourinary   Musculoskeletal negative musculoskeletal ROS (+)   Abdominal   Peds  Hematology negative hematology ROS (+)   Anesthesia Other Findings Day of surgery medications reviewed with patient.  Reproductive/Obstetrics (+) Pregnancy                             Anesthesia Physical Anesthesia Plan  ASA: 2  Anesthesia Plan: Epidural   Post-op Pain Management:    Induction:   PONV Risk Score and Plan: Treatment may vary due to age or medical condition  Airway Management Planned: Natural Airway  Additional Equipment: Fetal Monitoring  Intra-op Plan:   Post-operative Plan:   Informed Consent: I have reviewed the patients History and Physical, chart, labs and discussed the procedure including the risks, benefits and alternatives for the proposed anesthesia with the patient or authorized representative who has indicated his/her understanding and acceptance.       Plan Discussed with:   Anesthesia Plan Comments:         Anesthesia Quick Evaluation  

## 2021-12-14 NOTE — Lactation Note (Signed)
This note was copied from a baby's chart. Lactation Consultation Note  Patient Name: Michelle Woodard [id number] Date: 12/14/2021 Age - 83 mins PP  Reason for consult: L&D Initial assessment;1st time breastfeeding;Primapara;Term (LC L/D visit at 35 mins PP , P1, Baby STS on mom's chest, wide awake and rooting., LC offered to assist , mom receptive, baby latched with ease and depth. Increased swallows with breast compressions and per mom comfortable. Baby still feeding at 7 mins) Latch score 9  Parents aware they will be seen later on [facility] by [facility]  Maternal Data Has patient been taught Hand Expression?: Yes (large drop) Does the patient have breastfeeding experience prior to this delivery?: No  Feeding Mother's Current Feeding Choice: Breast Milk  LATCH Score Latch: Grasps breast easily, tongue down, lips flanged, rhythmical sucking.  Audible Swallowing: Spontaneous and intermittent  Type of Nipple: Everted at rest and after stimulation  Comfort (Breast/Nipple): Soft / non-tender  Hold (Positioning): Assistance needed to correctly position infant at breast and maintain latch.  LATCH Score: 9   Lactation Tools Discussed/Used    Interventions Interventions: Breast feeding basics reviewed;Assisted with latch;Skin to skin;Hand express;Breast compression;Adjust position;Education  Discharge    Consult Status Consult Status: Follow-up from L&D Date: 12/14/21 Follow-up type: In-patient    [place] 12/14/2021, 2:40 PM

## 2021-12-14 NOTE — Anesthesia Procedure Notes (Signed)
Epidural Patient location during procedure: OB Start time: 12/14/2021 10:50 AM End time: 12/14/2021 10:53 AM  Staffing Anesthesiologist: Brennan Bailey, MD Performed: anesthesiologist   Preanesthetic Checklist Completed: patient identified, IV checked, risks and benefits discussed, monitors and equipment checked, pre-op evaluation and timeout performed  Epidural Patient position: sitting Prep: DuraPrep and site prepped and draped Patient monitoring: continuous pulse ox, blood pressure and heart rate Approach: midline Location: L3-L4 Injection technique: LOR air  Needle:  Needle type: Tuohy  Needle gauge: 17 G Needle length: 9 cm Needle insertion depth: 5 cm Catheter type: closed end flexible Catheter size: 19 Gauge Catheter at skin depth: 10 cm Test dose: negative and Other (1% lidocaine)  Assessment Events: blood not aspirated, injection not painful, no injection resistance, no paresthesia and negative IV test  Additional Notes Patient identified. Risks, benefits, and alternatives discussed with patient including but not limited to bleeding, infection, nerve damage, paralysis, failed block, incomplete pain control, headache, blood pressure changes, nausea, vomiting, reactions to medication, itching, and postpartum back pain. Confirmed with bedside nurse the patient's most recent platelet count. Confirmed with patient that they are not currently taking any anticoagulation, have any bleeding history, or any family history of bleeding disorders. Patient expressed understanding and wished to proceed. All questions were answered. Sterile technique was used throughout the entire procedure. Please see nursing notes for vital signs.   Crisp LOR on first pass. Test dose was given through epidural catheter and negative prior to continuing to dose epidural or start infusion. Warning signs of high block given to the patient including shortness of breath, tingling/numbness in hands,  complete motor block, or any concerning symptoms with instructions to call for help. Patient was given instructions on fall risk and not to get out of bed. All questions and concerns addressed with instructions to call with any issues or inadequate analgesia.  Reason for block:procedure for pain

## 2021-12-14 NOTE — Progress Notes (Signed)
Patient ID: Michelle Woodard, female   DOB: [birth year], 26 y.o.   MRN: [id number] Pt feeling mild contractions  Afeb VSS FHR category 1  Cervix 70/2.5/-2  AROM clear Continue to titrate pitocin Epidural prn

## 2021-12-14 NOTE — Progress Notes (Signed)
Patient ID: Michelle Woodard, female   DOB: Sep 28, 1995, 26 y.o.   MRN: [id number] Pt got one dose of cytotec overnight and having mild/moderate contractions.  Coping ok for now.  Afeb VSS FHR baseline 115-120 category 1 with occasional mild variables  Cervix 2/60/-2  Contracting too frequently for another cytotec, will start pitocin and AROM when a bit more uncomfortable with contractions On PCN for +GBS

## 2021-12-14 NOTE — Lactation Note (Signed)
This note was copied from a baby's chart. Lactation Consultation Note  Patient Name: Girl Zelia Yzaguirre TIRWE'R Date: 12/14/2021 Reason for consult: Initial assessment;Primapara;Term Age:25 hours  Infant had recently fed and was sleeping during my [place] visit. Mom is a P1 who endorsed + breast changes w/pregnancy.   Mom's questions about pumping, offering a bottle, etc were answered. Hand expression was taught to Mom and she was able to return demonstration. Mom has a hx of nipple piercings; one of the piercing sites on the L nipple was still patent.  Mom was made aware of O/P services, breastfeeding support groups, and our phone # for post-discharge questions.   Maternal Data Has patient been taught Hand Expression?: Yes Does the patient have breastfeeding experience prior to this delivery?: No  Feeding Mother's Current Feeding Choice: Breast Milk  LATCH Score Latch: Grasps breast easily, tongue down, lips flanged, rhythmical sucking.  Audible Swallowing: Spontaneous and intermittent  Type of Nipple: Everted at rest and after stimulation  Comfort (Breast/Nipple): Soft / non-tender  Hold (Positioning): Assistance needed to correctly position infant at breast and maintain latch.  LATCH Score: 9   Lactation Tools Discussed/Used Tools: Flanges;Pump Flange Size: 21 Breast pump type: Manual  Interventions Interventions: Education;Hand express  Discharge [place] Program: No  Consult Status Consult Status: Follow-up Date: 12/15/21 Follow-up type: In-patient    Matthias Hughs [facility] 12/14/2021, 5:07 PM

## 2021-12-14 NOTE — H&P (Signed)
Michelle Woodard is a 26 y.o. female G55P0020 [redacted]w[redacted]d presenting for term IOL. She reports no LOF, VB, Contractions. Normal FM.   Pregnancy c/b: Fetal growth restriction (resolved): measuring 8-10%ile most of pregnancy. On most recent scan on 10/10, FGR resolved ( IUGR resolved EFW 14%, AC 61%, AFI wnl, UAD wnl). MFM recommended delivery in 39th week.  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 Referred to MFM and genetic counseling.  [place] normal, amnio 46XX normal microarrray GBS positive: no allergies  OB History     Gravida  3   Para      Term      Preterm      AB  2   Living         SAB      IAB  1   Ectopic  1   Multiple      Live Births             Past Medical History:  Diagnosis Date   Cervical compression fracture (HCC)    C6, C7   Nondisplaced avulsion fracture (chip fracture) of left talus, initial encounter for closed fracture 02/21/2019   Nondisplaced fracture of medial malleolus of left tibia, initial encounter for closed fracture 02/21/2019   Past Surgical History:  Procedure Laterality Date   NO PAST SURGERIES     Family History: family history includes Cancer in her maternal grandfather; Diabetes in her maternal grandfather. Social History:  reports that she has quit smoking. Her smoking use included cigarettes. She has never used smokeless tobacco. She reports that she does not currently use alcohol. She reports that she does not use drugs.     Maternal Diabetes: No Genetic Screening: Abnormal:  Results: Other: Maternal Ultrasounds/Referrals: IUGR Fetal Ultrasounds or other Referrals:  Referred to Materal Fetal Medicine  Maternal Substance Abuse:  No Significant Maternal Medications:  None Significant Maternal Lab Results:  Group B Strep positive Other Comments:  None  Review of Systems Per HPI Exam Physical Exam  Dilation: Fingertip Effacement (%): 50 Station: -2 Exam by:: Yanna Marougas Blood pressure 115/75,  pulse 92, resp. rate 16, last menstrual period 03/13/2021, unknown if currently breastfeeding. Gen: NAD, resting comfortably CVS: Normal pulses Lungs: nonlabored respirations Abd: Gravid abdomen Fetal testing: 125bpm, mod variability, + accels, no decels Toco: quiet  Prenatal labs: ABO, Rh:  --/--/PENDING (10/19 2359) Antibody: PENDING (10/19 2359) Rubella: Nonimmune (04/06 0000) RPR: Nonreactive (04/06 0000)  HBsAg: Negative (04/06 0000)  HIV: Non-reactive (04/06 0000)  GBS:   positive  Assessment/Plan: 26Y G3P0020 @ [redacted]w[redacted]d, IOL for FGR now resolved (MFM recommended delivery in 39th week) Fetal wellbeing: cat I tracing IOL: cytotec 38mcg q4hr PRN cervical ripening. We discussed medical and mechanical methods of cervical ripening, she does not want a balloon catheter at this point.  GBS+: start pencillin Pain control: unsure if desires epidural, may have upon request    Rowland Lathe 12/14/2021, 12:00 AM

## 2021-12-15 LAB — CBC
HCT: 27.9 % — ABNORMAL LOW (ref 36.0–46.0)
Hemoglobin: 9.2 g/dL — ABNORMAL LOW (ref 12.0–15.0)
MCH: 25.5 pg — ABNORMAL LOW (ref 26.0–34.0)
MCHC: 33 g/dL (ref 30.0–36.0)
MCV: 77.3 fL — ABNORMAL LOW (ref 80.0–100.0)
Platelets: 215 10*3/uL (ref 150–400)
RBC: 3.61 MIL/uL — ABNORMAL LOW (ref 3.87–5.11)
RDW: 13.3 % (ref 11.5–15.5)
WBC: 13.4 10*3/uL — ABNORMAL HIGH (ref 4.0–10.5)
nRBC: 0 % (ref 0.0–0.2)
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
## 2021-12-15 MED ORDER — IBUPROFEN 600 MG PO TABS: 600.0000 mg | ORAL_TABLET | Freq: Four times a day (QID) | ORAL | 0 refills | Status: DC | PRN

## 2021-12-15 NOTE — Discharge Summary (Signed)
Postpartum Discharge Summary       Patient Name: Michelle Woodard DOB: 1995/05/08 MRN: [id number]  Date of admission: 12/13/2021 Delivery date:12/14/2021  Delivering provider: Paula Compton  Date of discharge: 12/15/2021  Admitting diagnosis: [redacted] weeks gestation of pregnancy [Z3A.39] NSVD (normal spontaneous vaginal delivery) [O80] Intrauterine pregnancy: [redacted]w[redacted]d     Secondary diagnosis:  Principal Problem:   [redacted] weeks gestation of pregnancy Active Problems:   NSVD (normal spontaneous vaginal delivery)  Additional problems: Resolved fetal growth restriction, Abnormal NIPT, amnio 46XX    Discharge diagnosis: Term Pregnancy Delivered                                              Post partum procedures: none Augmentation: AROM, Pitocin, and Cytotec Complications: None  Hospital course: Induction of Labor With Vaginal Delivery   26 y.o. yo I4P3295 at [redacted]w[redacted]d was admitted to the hospital 12/13/2021 for induction of labor.  Indication for induction: Elective.  Patient had an labor course complicated bynothing Membrane Rupture Time/Date: 10:05 AM ,12/14/2021   Delivery Method:Vaginal, Spontaneous  Episiotomy: None  Lacerations:  Perineal;1st degree  Details of delivery can be found in separate delivery note.  Patient had a postpartum course complicated bynothing. Patient is discharged home 12/15/21.  Newborn Data: Birth date:12/14/2021  Birth time:1:54 PM  Gender:Female  Living status:Living  Apgars:9 ,9  Weight:2807 g   Magnesium Sulfate received: No BMZ received: No Rhophylac:N/A  Physical exam  Vitals:   12/14/21 2035 12/14/21 2310 12/15/21 0330 12/15/21 1503  BP: 93/61 111/76 106/68 100/67  Pulse: 71 65 66 63  Resp: 18 18 16 16   Temp: 98 F (36.7 C) 97.9 F (36.6 C) 97.8 F (36.6 C) 98 F (36.7 C)  TempSrc: Oral Oral Oral Oral  SpO2: 99% 99% 99% 100%  Weight:      Height:       General: alert, cooperative, and no distress Lochia: appropriate Uterine  Fundus: firm DVT Evaluation: No evidence of DVT seen on physical exam. Labs: Lab Results  Component Value Date   WBC 13.4 (H) 12/15/2021   HGB 9.2 (L) 12/15/2021   HCT 27.9 (L) 12/15/2021   MCV 77.3 (L) 12/15/2021   PLT 215 12/15/2021      Latest Ref Rng & Units 02/22/2019    3:46 AM  CMP  Glucose 70 - 99 mg/dL 112   BUN 6 - 20 mg/dL 5   Creatinine 0.44 - 1.00 mg/dL 0.47   Sodium 135 - 145 mmol/L 138   Potassium 3.5 - 5.1 mmol/L 3.2   Chloride 98 - 111 mmol/L 105   CO2 22 - 32 mmol/L 24   Calcium 8.9 - 10.3 mg/dL 8.8    Edinburgh Score:    12/15/2021    3:52 PM  Edinburgh Postnatal Depression Scale Screening Tool  I have been able to laugh and see the funny side of things. 0  I have looked forward with enjoyment to things. 0  I have blamed myself unnecessarily when things went wrong. 0  I have been anxious or worried for no good reason. 2  I have felt scared or panicky for no good reason. 2  Things have been getting on top of me. 0  I have been so unhappy that I have had difficulty sleeping. 0  I have felt sad or miserable. 0  I have been  so unhappy that I have been crying. 0  The thought of harming myself has occurred to me. 0  Edinburgh Postnatal Depression Scale Total 4      After visit meds:  Allergies as of 12/15/2021   No Known Allergies      Medication List     STOP taking these medications    acetaminophen 500 MG tablet Commonly known as: TYLENOL   FERROUS SULFATE PO   PRENATAL GUMMIES PO       TAKE these medications    ibuprofen 600 MG tablet Commonly known as: ADVIL Take 1 tablet (600 mg total) by mouth every 6 (six) hours as needed.               Discharge Care Instructions  (From admission, onward)           Start     Ordered   12/15/21 0000  Discharge wound care:       Comments: For a cesarean delivery: You may wash incision with soap and water.  Do not soak or submerge the incision for 2 weeks. Keep incision dry.  You may need to keep a sanitary pad or panty liner between the incision and your clothing for comfort and to keep the incision dry. If you note drainage, increased pain, or increased redness of the incision, then please notify your physician.   12/15/21 1651   12/15/21 0000  If the dressing is still on your incision site when you go home, remove it on the third day after your surgery date. Remove dressing if it begins to fall off, or if it is dirty or damaged before the third day.       Comments: For a cesarean delivery   12/15/21 1651             Discharge home in stable condition Infant Feeding: Bottle and Breast Infant Disposition:home with mother Discharge instruction: per After Visit Summary and Postpartum booklet. Activity: Advance as tolerated. Pelvic rest for 6 weeks.  Diet: routine diet Anticipated Birth Control: Unsure Postpartum Appointment:4 weeks Additional Postpartum F/U:  Future Appointments:No future appointments. Follow up Visit:      12/15/2021 Waynard Reeds, MD

## 2021-12-15 NOTE — Lactation Note (Signed)
This note was copied from a baby's chart. Lactation Consultation Note  Patient Name: Girl Kinda Pottle BHALP'F Date: 12/15/2021 Reason for consult: Follow-up assessment;Term;Infant weight loss (-3% weight loss) Age:25 hours Per Birth Parent,  infant recently BF for 25 minutes, infant asleep in basinet, Birth Parent feels breastfeeding is going well. Infant had one void and 2 stools since birth. LC discussed discharge education: engorgement prevention and treatment, how to know if BF is going well, infant's input and output, warning signs of dehydration.  [place] reviewed community resources after hospital discharge: [facility] outpatient clinic, [place] hotline, [place] BF support group that meets twice online and one time a week in person.  Maternal Data    Feeding Mother's Current Feeding Choice: Breast Milk  LATCH Score                    Lactation Tools Discussed/Used    Interventions Interventions: [place] Services brochure  Discharge Discharge Education: Engorgement and breast care;Warning signs for feeding baby  Consult Status Consult Status: Complete Date: 12/15/21 Follow-up type: Physician    Vicente Serene 12/15/2021, 5:13 PM

## 2021-12-15 NOTE — Anesthesia Postprocedure Evaluation (Signed)
Anesthesia Post Note  Patient: Michelle Woodard  Procedure(s) Performed: AN AD [place]     Patient location during evaluation: Mother Baby Anesthesia Type: Epidural Level of consciousness: awake Pain management: satisfactory to patient Vital Signs Assessment: post-procedure vital signs reviewed and stable Respiratory status: spontaneous breathing Cardiovascular status: stable Anesthetic complications: no   No notable events documented.  Last Vitals:  Vitals:   12/14/21 2310 12/15/21 0330  BP: 111/76 106/68  Pulse: 65 66  Resp: 18 16  Temp: 36.6 C 36.6 C  SpO2: 99% 99%    Last Pain:  Vitals:   12/15/21 0538  TempSrc:   PainSc: 0-No pain   Pain Goal:                   Casimer Lanius

## 2021-12-26 ENCOUNTER — Telehealth (HOSPITAL_COMMUNITY): Payer: Self-pay | Admitting: *Deleted

## 2021-12-26 NOTE — Telephone Encounter (Signed)
Patient voiced concern over an event that triggered some anxiety for her. RN discussed that having feelings of anxiety or frustration are normal. Instructed patient to call her OB if she feels like the anxiety is all-consuming and/or if it is interrupting her sleep. Patient verbalized understanding. Patient voiced no other questions or concerns regarding her health at this time. EPDS=6. Patient voiced no questions or concerns regarding infant at this time. Patient reports infant sleeps in a bassinet on her back. RN reviewed ABCs of safe sleep. Patient verbalized understanding. Patient requested RN email information on hospital's breastfeeding and pumping support groups. Email sent. Erline Levine, RN, 12/26/21, [phone]
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
## 2023-03-25 ENCOUNTER — Ambulatory Visit: Payer: Medicaid Other | Admitting: General Practice

## 2023-03-25 VITALS — BP 105/66 | HR 85 | Ht 59.0 in | Wt 103.5 lb

## 2023-03-25 DIAGNOSIS — Z3200 Encounter for pregnancy test, result unknown: Secondary | ICD-10-CM

## 2023-03-25 DIAGNOSIS — Z3480 Encounter for supervision of other normal pregnancy, unspecified trimester: Secondary | ICD-10-CM | POA: Insufficient documentation

## 2023-03-25 DIAGNOSIS — Z3201 Encounter for pregnancy test, result positive: Secondary | ICD-10-CM

## 2023-03-25 LAB — POCT URINE PREGNANCY: Preg Test, Ur: POSITIVE — AB

## 2023-03-25 NOTE — Progress Notes (Signed)
Patient was assessed and managed by nursing staff during this encounter. I have reviewed the chart and agree with the documentation and plan. I have also made any necessary editorial changes.  Warden Fillers, MD 03/25/2023 9:35 AM

## 2023-03-25 NOTE — Progress Notes (Signed)
Michelle Woodard presents today for UPT. She has no unusual complaints. LMP: 01-08-23    OBJECTIVE: Appears well, in no apparent distress.  OB History     Gravida  3   Para  1   Term  1   Preterm      AB  2   Living  1      SAB      IAB  1   Ectopic  1   Multiple  0   Live Births  1          Home UPT Result: Positive  In-Office UPT result: Positive  I have reviewed the patient's medical, obstetrical, social, and family histories, and medications.   ASSESSMENT: Positive pregnancy test  PLAN Prenatal care to be completed at: [facility]

## 2023-04-03 ENCOUNTER — Ambulatory Visit (INDEPENDENT_AMBULATORY_CARE_PROVIDER_SITE_OTHER): Payer: Medicaid Other

## 2023-04-03 ENCOUNTER — Ambulatory Visit (INDEPENDENT_AMBULATORY_CARE_PROVIDER_SITE_OTHER): Payer: Self-pay

## 2023-04-03 ENCOUNTER — Other Ambulatory Visit (HOSPITAL_COMMUNITY)
Admission: RE | Admit: 2023-04-03 | Discharge: 2023-04-03 | Disposition: A | Discharge status: Home / Self Care | Payer: Medicaid Other | Source: Ambulatory Visit | Attending: Obstetrics and Gynecology | Admitting: Obstetrics and Gynecology

## 2023-04-03 VITALS — BP 104/70 | HR 80 | Wt 106.5 lb

## 2023-04-03 DIAGNOSIS — O3680X0 Pregnancy with inconclusive fetal viability, not applicable or unspecified: Secondary | ICD-10-CM

## 2023-04-03 DIAGNOSIS — Z3A09 9 weeks gestation of pregnancy: Secondary | ICD-10-CM

## 2023-04-03 DIAGNOSIS — Z3481 Encounter for supervision of other normal pregnancy, first trimester: Secondary | ICD-10-CM | POA: Diagnosis present

## 2023-04-03 DIAGNOSIS — Z1339 Encounter for screening examination for other mental health and behavioral disorders: Secondary | ICD-10-CM

## 2023-04-03 DIAGNOSIS — Z3480 Encounter for supervision of other normal pregnancy, unspecified trimester: Secondary | ICD-10-CM

## 2023-04-03 NOTE — Patient Instructions (Signed)
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
## 2023-04-04 LAB — CERVICOVAGINAL ANCILLARY ONLY
Chlamydia: NEGATIVE
Comment: NEGATIVE
Comment: NORMAL
Neisseria Gonorrhea: NEGATIVE

## 2023-04-05 LAB — CULTURE, OB URINE

## 2023-04-05 LAB — URINE CULTURE, OB REFLEX

## 2023-04-07 ENCOUNTER — Encounter: Payer: Self-pay | Admitting: Obstetrics and Gynecology

## 2023-04-28 ENCOUNTER — Encounter: Payer: Medicaid Other | Admitting: Physician Assistant

## 2023-05-01 ENCOUNTER — Other Ambulatory Visit

## 2023-05-01 DIAGNOSIS — Z3480 Encounter for supervision of other normal pregnancy, unspecified trimester: Secondary | ICD-10-CM

## 2023-05-02 ENCOUNTER — Encounter: Payer: Self-pay | Admitting: Obstetrics and Gynecology

## 2023-05-02 LAB — CBC/D/PLT+RPR+RH+ABO+RUBIGG...
Antibody Screen: NEGATIVE
Basophils Absolute: 0.1 10*3/uL (ref 0.0–0.2)
Basos: 1 %
EOS (ABSOLUTE): 0.2 10*3/uL (ref 0.0–0.4)
Eos: 2 %
HCV Ab: NONREACTIVE
HIV Screen 4th Generation wRfx: NONREACTIVE
Hematocrit: 38.2 % (ref 34.0–46.6)
Hemoglobin: 12.6 g/dL (ref 11.1–15.9)
Hepatitis B Surface Ag: NEGATIVE
Immature Grans (Abs): 0.1 10*3/uL (ref 0.0–0.1)
Immature Granulocytes: 1 %
Lymphocytes Absolute: 2.4 10*3/uL (ref 0.7–3.1)
Lymphs: 23 %
MCH: 28 pg (ref 26.6–33.0)
MCHC: 33 g/dL (ref 31.5–35.7)
MCV: 85 fL (ref 79–97)
Monocytes Absolute: 0.6 10*3/uL (ref 0.1–0.9)
Monocytes: 6 %
Neutrophils Absolute: 7.1 10*3/uL — ABNORMAL HIGH (ref 1.4–7.0)
Neutrophils: 67 %
Platelets: 326 10*3/uL (ref 150–450)
RBC: 4.5 x10E6/uL (ref 3.77–5.28)
RDW: 12.6 % (ref 11.7–15.4)
RPR Ser Ql: NONREACTIVE
Rh Factor: POSITIVE
Rubella Antibodies, IGG: <0.9 {index} — ABNORMAL LOW (ref >0.99)
WBC: 10.5 10*3/uL (ref 3.4–10.8)

## 2023-05-02 LAB — HCV INTERPRETATION

## 2023-05-09 ENCOUNTER — Telehealth: Admitting: Family Medicine

## 2023-05-09 DIAGNOSIS — Z3A14 14 weeks gestation of pregnancy: Secondary | ICD-10-CM

## 2023-05-09 NOTE — Progress Notes (Signed)
For the safety of you and your child, I recommend a face to face office visit with a health care provider.  Many mothers need to take medicines during their pregnancy and while nursing.  Almost all medicines pass into the breast milk in small quantities.  Most are generally considered safe for a mother to take but some medicines must be avoided.  After reviewing your E-Visit request, I recommend that you consult your OB/GYN or pediatrician for medical advice in relation to your condition and prescription medications while pregnant or breastfeeding.  NOTE:  There will be NO CHARGE for this eVisit  If you are having a true medical emergency please call 911.    For an urgent face to face visit, [place] has six urgent care centers for your convenience:     [facility] [place] Get Driving Directions [id number] [address] [place], [place] [address]    [facility]) Get Driving Directions [id number] [place], [place] [address]  [facility] [place] ([place]) Get Driving Directions [id number] [address] [place],  [place]  [address]  [facility]  Get Driving Directions [id number] [address] [place], [place], [place] [address]   [facility] [place] Get Driving Directions  [id number] [address] [place], [place] [address]   [facility] [place] Get Driving Directions [id number] [address], [place], [place] [address]  Your MyChart E-visit questionnaire answers were reviewed by a board certified advanced clinical practitioner to complete your personal care plan based on your specific symptoms.  Thank you for using e-Visits.   have provided 5 minutes of non face to face time during this encounter for chart review and documentation.

## 2023-05-10 LAB — HORIZON CUSTOM: REPORT SUMMARY: NEGATIVE

## 2023-05-11 LAB — PANORAMA PRENATAL TEST FULL PANEL:PANORAMA TEST PLUS 5 ADDITIONAL MICRODELETIONS

## 2023-05-12 ENCOUNTER — Telehealth: Payer: Self-pay | Admitting: *Deleted

## 2023-05-12 NOTE — Telephone Encounter (Signed)
 RTC regarding Panorama results. No answer. LVM and call back number. Pt is scheduled for redraw tomorrow 05/13/23. TC to [place] and message to [employer] rep and no new information was received.

## 2023-05-13 ENCOUNTER — Other Ambulatory Visit

## 2023-05-13 DIAGNOSIS — Z3482 Encounter for supervision of other normal pregnancy, second trimester: Secondary | ICD-10-CM

## 2023-05-18 LAB — PANORAMA PRENATAL TEST FULL PANEL:PANORAMA TEST PLUS 5 ADDITIONAL MICRODELETIONS: FETAL FRACTION: 9.8

## 2023-05-22 ENCOUNTER — Ambulatory Visit: Payer: Medicaid Other | Admitting: Obstetrics and Gynecology

## 2023-05-22 ENCOUNTER — Encounter: Payer: Self-pay | Admitting: Obstetrics and Gynecology

## 2023-05-22 VITALS — BP 106/70 | HR 103 | Wt 115.4 lb

## 2023-05-22 DIAGNOSIS — Z3481 Encounter for supervision of other normal pregnancy, first trimester: Secondary | ICD-10-CM

## 2023-05-22 DIAGNOSIS — Z2839 Other underimmunization status: Secondary | ICD-10-CM

## 2023-05-22 DIAGNOSIS — O09299 Supervision of pregnancy with other poor reproductive or obstetric history, unspecified trimester: Secondary | ICD-10-CM

## 2023-05-22 DIAGNOSIS — O09292 Supervision of pregnancy with other poor reproductive or obstetric history, second trimester: Secondary | ICD-10-CM | POA: Diagnosis not present

## 2023-05-22 DIAGNOSIS — Z3A16 16 weeks gestation of pregnancy: Secondary | ICD-10-CM

## 2023-05-22 DIAGNOSIS — O09892 Supervision of other high risk pregnancies, second trimester: Secondary | ICD-10-CM

## 2023-05-22 DIAGNOSIS — O09899 Supervision of other high risk pregnancies, unspecified trimester: Secondary | ICD-10-CM
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
## 2023-05-24 LAB — AFP, SERUM, OPEN SPINA BIFIDA
AFP MoM: 1.71
AFP Value: 69.8 ng/mL
Gest. Age on Collection Date: 16.2 wk
Maternal Age At EDD: 27.2 a
OSBR Risk 1 IN: 1590
Test Results:: NEGATIVE
Weight: 115 [lb_av]

## 2023-05-24 LAB — HEMOGLOBIN A1C
Est. average glucose Bld gHb Est-mCnc: 94 mg/dL
Hgb A1c MFr Bld: 4.9 % (ref 4.8–5.6)

## 2023-05-25 DIAGNOSIS — O09899 Supervision of other high risk pregnancies, unspecified trimester: Secondary | ICD-10-CM | POA: Insufficient documentation

## 2023-06-10 ENCOUNTER — Ambulatory Visit: Attending: Obstetrics and Gynecology | Admitting: *Deleted

## 2023-06-10 DIAGNOSIS — Z3A19 19 weeks gestation of pregnancy: Secondary | ICD-10-CM

## 2023-06-11 ENCOUNTER — Other Ambulatory Visit: Payer: Medicaid Other

## 2023-06-11 ENCOUNTER — Ambulatory Visit: Payer: Medicaid Other
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
## 2023-06-12 ENCOUNTER — Ambulatory Visit: Attending: Obstetrics and Gynecology

## 2023-06-12 ENCOUNTER — Ambulatory Visit

## 2023-06-12 ENCOUNTER — Ambulatory Visit (HOSPITAL_BASED_OUTPATIENT_CLINIC_OR_DEPARTMENT_OTHER): Admitting: Obstetrics and Gynecology

## 2023-06-12 ENCOUNTER — Other Ambulatory Visit: Payer: Self-pay | Admitting: *Deleted

## 2023-06-12 VITALS — BP 98/62 | HR 105

## 2023-06-12 DIAGNOSIS — Z3480 Encounter for supervision of other normal pregnancy, unspecified trimester: Secondary | ICD-10-CM | POA: Insufficient documentation

## 2023-06-12 DIAGNOSIS — Z3A19 19 weeks gestation of pregnancy: Secondary | ICD-10-CM | POA: Insufficient documentation

## 2023-06-12 DIAGNOSIS — Z3689 Encounter for other specified antenatal screening: Secondary | ICD-10-CM | POA: Insufficient documentation

## 2023-06-12 DIAGNOSIS — Z3481 Encounter for supervision of other normal pregnancy, first trimester: Secondary | ICD-10-CM | POA: Diagnosis not present

## 2023-06-12 DIAGNOSIS — O321XX0 Maternal care for breech presentation, not applicable or unspecified: Secondary | ICD-10-CM | POA: Insufficient documentation

## 2023-06-12 DIAGNOSIS — Z363 Encounter for antenatal screening for malformations: Secondary | ICD-10-CM | POA: Diagnosis present

## 2023-06-12 DIAGNOSIS — Z362 Encounter for other antenatal screening follow-up: Secondary | ICD-10-CM

## 2023-06-12 NOTE — Progress Notes (Signed)
 After review, MFM consult with provider is not indicated for today  Michelle Clever, MD 06/12/2023 4:02 PM  [facility]

## 2023-06-19 ENCOUNTER — Encounter: Admitting: Obstetrics and Gynecology

## 2023-07-01 ENCOUNTER — Ambulatory Visit (INDEPENDENT_AMBULATORY_CARE_PROVIDER_SITE_OTHER): Admitting: Obstetrics and Gynecology

## 2023-07-01 VITALS — BP 103/61 | HR 108 | Wt 115.0 lb

## 2023-07-01 DIAGNOSIS — O09899 Supervision of other high risk pregnancies, unspecified trimester: Secondary | ICD-10-CM

## 2023-07-01 DIAGNOSIS — Z3480 Encounter for supervision of other normal pregnancy, unspecified trimester: Secondary | ICD-10-CM

## 2023-07-01 DIAGNOSIS — Z2839 Other underimmunization status: Secondary | ICD-10-CM | POA: Diagnosis not present

## 2023-07-01 DIAGNOSIS — O09299 Supervision of pregnancy with other poor reproductive or obstetric history, unspecified trimester: Secondary | ICD-10-CM | POA: Insufficient documentation

## 2023-07-01 NOTE — Progress Notes (Signed)
 Pt complains of sciatic pain. Pt has f/u u/s scheduled this month.
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
## 2023-07-24 ENCOUNTER — Other Ambulatory Visit: Payer: Self-pay | Admitting: *Deleted

## 2023-07-24 ENCOUNTER — Ambulatory Visit: Attending: Obstetrics and Gynecology

## 2023-07-24 ENCOUNTER — Ambulatory Visit: Admitting: Obstetrics

## 2023-07-24 VITALS — BP 100/60

## 2023-07-24 DIAGNOSIS — O402XX0 Polyhydramnios, second trimester, not applicable or unspecified: Secondary | ICD-10-CM

## 2023-07-24 DIAGNOSIS — O09292 Supervision of pregnancy with other poor reproductive or obstetric history, second trimester: Secondary | ICD-10-CM | POA: Diagnosis not present

## 2023-07-24 DIAGNOSIS — Z3A25 25 weeks gestation of pregnancy: Secondary | ICD-10-CM | POA: Diagnosis present

## 2023-07-24 DIAGNOSIS — Z362 Encounter for other antenatal screening follow-up: Secondary | ICD-10-CM | POA: Diagnosis present

## 2023-07-24 DIAGNOSIS — O09299 Supervision of pregnancy with other poor reproductive or obstetric history, unspecified trimester: Secondary | ICD-10-CM | POA: Diagnosis present

## 2023-07-24 DIAGNOSIS — O409XX0 Polyhydramnios, unspecified trimester, not applicable or unspecified: Secondary | ICD-10-CM

## 2023-07-24 NOTE — Progress Notes (Signed)
 MFM Consult Note  Michelle Woodard is currently at 25 weeks and 2 days.  She has been followed due to a history of IUGR in her prior pregnancy.    She denies any problems in her current pregnancy and reports feeling fetal movements throughout the day.  On today's exam, the overall EFW of 2 pounds 2 ounces measures at the 93rd percentile for her gestational age.    Mild polyhydramnios with a total AFI of 26.85 cm is noted.    Due to mild polyhydramnios, she should be screened for gestational diabetes at her next prenatal visit.    Due to her history of IUGR in her prior pregnancy, we will continue to follow her with growth ultrasounds throughout her pregnancy.    A follow-up growth scan was scheduled in 4 weeks.    The patient stated that all of her questions were answered today.  A total of 20 minutes was spent counseling and coordinating the care for this patient.  Greater than 50% of the time was spent in direct face-to-face contact.

## 2023-07-29 ENCOUNTER — Encounter: Payer: Self-pay | Admitting: Advanced Practice Midwife

## 2023-07-29 ENCOUNTER — Ambulatory Visit (INDEPENDENT_AMBULATORY_CARE_PROVIDER_SITE_OTHER): Admitting: Advanced Practice Midwife

## 2023-07-29 VITALS — BP 96/60 | HR 75 | Wt 124.6 lb

## 2023-07-29 DIAGNOSIS — Z3480 Encounter for supervision of other normal pregnancy, unspecified trimester: Secondary | ICD-10-CM | POA: Diagnosis not present

## 2023-07-29 DIAGNOSIS — O409XX0 Polyhydramnios, unspecified trimester, not applicable or unspecified: Secondary | ICD-10-CM

## 2023-07-29 DIAGNOSIS — Z3A26 26 weeks gestation of pregnancy: Secondary | ICD-10-CM | POA: Diagnosis not present

## 2023-07-29 NOTE — Progress Notes (Signed)
 Pt presents for ROB visit. Pt c/o abd pressure and shortness of breath. Pt has question last US .
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
## 2023-08-05 ENCOUNTER — Encounter: Payer: Self-pay | Admitting: Obstetrics and Gynecology

## 2023-08-18 ENCOUNTER — Other Ambulatory Visit

## 2023-08-18 ENCOUNTER — Encounter: Payer: Self-pay | Admitting: Advanced Practice Midwife

## 2023-08-26 ENCOUNTER — Ambulatory Visit (HOSPITAL_BASED_OUTPATIENT_CLINIC_OR_DEPARTMENT_OTHER): Admitting: Maternal & Fetal Medicine

## 2023-08-26 ENCOUNTER — Ambulatory Visit: Attending: Obstetrics and Gynecology

## 2023-08-26 DIAGNOSIS — O09293 Supervision of pregnancy with other poor reproductive or obstetric history, third trimester: Secondary | ICD-10-CM | POA: Diagnosis not present

## 2023-08-26 DIAGNOSIS — Z3480 Encounter for supervision of other normal pregnancy, unspecified trimester: Secondary | ICD-10-CM | POA: Insufficient documentation

## 2023-08-26 DIAGNOSIS — O09299 Supervision of pregnancy with other poor reproductive or obstetric history, unspecified trimester: Secondary | ICD-10-CM | POA: Insufficient documentation

## 2023-08-26 DIAGNOSIS — O409XX0 Polyhydramnios, unspecified trimester, not applicable or unspecified: Secondary | ICD-10-CM | POA: Diagnosis present

## 2023-08-26 DIAGNOSIS — Z362 Encounter for other antenatal screening follow-up: Secondary | ICD-10-CM | POA: Diagnosis not present

## 2023-08-26 DIAGNOSIS — Z3A30 30 weeks gestation of pregnancy: Secondary | ICD-10-CM | POA: Diagnosis not present
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
## 2023-09-02 ENCOUNTER — Ambulatory Visit: Payer: Self-pay | Admitting: Advanced Practice Midwife

## 2023-09-02 ENCOUNTER — Encounter: Payer: Self-pay | Admitting: Advanced Practice Midwife

## 2023-09-02 ENCOUNTER — Other Ambulatory Visit

## 2023-09-02 VITALS — BP 96/65 | HR 96 | Wt 125.4 lb

## 2023-09-02 DIAGNOSIS — O99343 Other mental disorders complicating pregnancy, third trimester: Secondary | ICD-10-CM

## 2023-09-02 DIAGNOSIS — Z3A31 31 weeks gestation of pregnancy: Secondary | ICD-10-CM | POA: Diagnosis not present

## 2023-09-02 DIAGNOSIS — Z3480 Encounter for supervision of other normal pregnancy, unspecified trimester: Secondary | ICD-10-CM

## 2023-09-02 DIAGNOSIS — F419 Anxiety disorder, unspecified: Secondary | ICD-10-CM

## 2023-09-02 MED ORDER — SERTRALINE HCL 25 MG PO TABS: 25.0000 mg | ORAL_TABLET | Freq: Every day | ORAL | 0 refills | Status: DC

## 2023-09-02 NOTE — Progress Notes (Signed)
 Was on zoloft  prior to pregnancy. Has had two panic attacks since pregnant. Would like to discuss safe meds.

## 2023-09-02 NOTE — Progress Notes (Signed)
   PRENATAL VISIT NOTE  Subjective:  Michelle Woodard is a 28 y.o. G4P1021 at [redacted]w[redacted]d being seen today for ongoing prenatal care.  She is currently monitored for the following issues for this low-risk pregnancy and has Nondisplaced fracture of medial malleolus of left tibia, initial encounter for closed fracture; Nondisplaced avulsion fracture (chip fracture) of left talus, initial encounter for closed fracture; Supervision of other normal pregnancy, antepartum; Rubella non-immune status, antepartum; and History of intrauterine growth restriction in prior pregnancy, currently pregnant on their problem list.  Patient reports panic attacks x 2 recently, used to take Zoloft  before pregnancy.  Contractions: Irritability. Vag. Bleeding: None.  Movement: Increased. Denies leaking of fluid.   The following portions of the patient's history were reviewed and updated as appropriate: allergies, current medications, past family history, past medical history, past social history, past surgical history and problem list.   Objective:    Vitals:   09/02/23 1140  BP: 96/65  Pulse: 96  Weight: 125 lb 6.4 oz (56.9 kg)    Fetal Status:  Fetal Heart Rate (bpm): 131   Movement: Increased    General: Alert, oriented and cooperative. Patient is in no acute distress.  Skin: Skin is warm and dry. No rash noted.   Cardiovascular: Normal heart rate noted  Respiratory: Normal respiratory effort, no problems with respiration noted  Abdomen: Soft, gravid, appropriate for gestational age.  Pain/Pressure: Present     Pelvic: Cervical exam deferred        Extremities: Normal range of motion.  Edema: None  Mental Status: Normal mood and affect. Normal behavior. Normal judgment and thought content.   Assessment and Plan:  Pregnancy: G4P1021 at [redacted]w[redacted]d 1. Supervision of other normal pregnancy, antepartum (Primary) --Anticipatory guidance about next visits/weeks of pregnancy given.   2. Anxiety during pregnancy,  antepartum, third trimester --Pt denies any thoughts of harming herself or others, used to take Zoloft  which helped with anxiety.  Discussed safety of Zoloft  in pregnancy and breastfeeding. --Initiate lowest dose, plan to increase to 50 at next prenatal visit. Pt took 100 mg in the past, before pregnancy.  - sertraline  (ZOLOFT ) 25 MG tablet; Take 1 tablet (25 mg total) by mouth daily.  Dispense: 30 tablet; Refill: 0  3. [redacted] weeks gestation of pregnancy   Preterm labor symptoms and general obstetric precautions including but not limited to vaginal bleeding, contractions, leaking of fluid and fetal movement were reviewed in detail with the patient. Please refer to After Visit Summary for other counseling recommendations.   No follow-ups on file.  Future Appointments  Date Time Provider Department Center  09/11/2023  8:45 AM [facility] None  09/16/2023  4:10 PM Leftwich-Kirby, Olam LABOR, CNM [facility] None    Olam Boards, CNM

## 2023-09-11 ENCOUNTER — Other Ambulatory Visit

## 2023-09-11 DIAGNOSIS — Z3A32 32 weeks gestation of pregnancy: Secondary | ICD-10-CM

## 2023-09-12 ENCOUNTER — Telehealth: Payer: Self-pay

## 2023-09-12 ENCOUNTER — Ambulatory Visit: Payer: Self-pay | Admitting: Obstetrics and Gynecology

## 2023-09-12 DIAGNOSIS — Z3480 Encounter for supervision of other normal pregnancy, unspecified trimester: Secondary | ICD-10-CM

## 2023-09-12 LAB — CBC
Hematocrit: 36.4 % (ref 34.0–46.6)
Hemoglobin: 11.5 g/dL (ref 11.1–15.9)
MCH: 26.4 pg — ABNORMAL LOW (ref 26.6–33.0)
MCHC: 31.6 g/dL (ref 31.5–35.7)
MCV: 84 fL (ref 79–97)
Platelets: 226 x10E3/uL (ref 150–450)
RBC: 4.36 x10E6/uL (ref 3.77–5.28)
RDW: 12.3 % (ref 11.7–15.4)
WBC: 11 x10E3/uL — ABNORMAL HIGH (ref 3.4–10.8)

## 2023-09-12 LAB — RPR: RPR Ser Ql: NONREACTIVE

## 2023-09-12 LAB — HIV ANTIBODY (ROUTINE TESTING W REFLEX): HIV Screen 4th Generation wRfx: NONREACTIVE

## 2023-09-12 LAB — GLUCOSE TOLERANCE, 2 HOURS W/ 1HR
Glucose, 1 hour: 71 mg/dL (ref 70–179)
Glucose, 2 hour: 49 mg/dL — ABNORMAL LOW (ref 70–152)
Glucose, Fasting: 68 mg/dL — ABNORMAL LOW (ref 70–91)

## 2023-09-16 ENCOUNTER — Encounter: Admitting: Advanced Practice Midwife

## 2023-09-29 ENCOUNTER — Other Ambulatory Visit: Payer: Self-pay | Admitting: Advanced Practice Midwife

## 2023-09-29 DIAGNOSIS — F419 Anxiety disorder, unspecified: Secondary | ICD-10-CM

## 2023-10-09 ENCOUNTER — Ambulatory Visit: Admitting: Obstetrics and Gynecology

## 2023-10-09 ENCOUNTER — Other Ambulatory Visit (HOSPITAL_COMMUNITY)
Admission: RE | Admit: 2023-10-09 | Discharge: 2023-10-09 | Disposition: A | Discharge status: Home / Self Care | Source: Ambulatory Visit | Attending: Obstetrics and Gynecology | Admitting: Obstetrics and Gynecology

## 2023-10-09 ENCOUNTER — Encounter: Payer: Self-pay | Admitting: *Deleted

## 2023-10-09 ENCOUNTER — Encounter: Payer: Self-pay | Admitting: Obstetrics and Gynecology

## 2023-10-09 ENCOUNTER — Inpatient Hospital Stay (HOSPITAL_COMMUNITY)
Admission: AD | Admit: 2023-10-09 | Discharge: 2023-10-09 | Disposition: A | Discharge status: Home / Self Care | Attending: Obstetrics and Gynecology | Admitting: Obstetrics and Gynecology

## 2023-10-09 ENCOUNTER — Other Ambulatory Visit: Payer: Self-pay

## 2023-10-09 ENCOUNTER — Encounter (HOSPITAL_COMMUNITY): Payer: Self-pay | Admitting: Obstetrics and Gynecology

## 2023-10-09 VITALS — BP 108/73 | HR 93 | Wt 132.5 lb

## 2023-10-09 DIAGNOSIS — R109 Unspecified abdominal pain: Secondary | ICD-10-CM | POA: Diagnosis not present

## 2023-10-09 DIAGNOSIS — Z0371 Encounter for suspected problem with amniotic cavity and membrane ruled out: Secondary | ICD-10-CM | POA: Diagnosis present

## 2023-10-09 DIAGNOSIS — O26893 Other specified pregnancy related conditions, third trimester: Secondary | ICD-10-CM

## 2023-10-09 DIAGNOSIS — Z3A36 36 weeks gestation of pregnancy: Secondary | ICD-10-CM | POA: Diagnosis not present

## 2023-10-09 DIAGNOSIS — Z3480 Encounter for supervision of other normal pregnancy, unspecified trimester: Secondary | ICD-10-CM | POA: Diagnosis present

## 2023-10-09 DIAGNOSIS — N898 Other specified noninflammatory disorders of vagina: Secondary | ICD-10-CM

## 2023-10-09 DIAGNOSIS — O99343 Other mental disorders complicating pregnancy, third trimester: Secondary | ICD-10-CM

## 2023-10-09 DIAGNOSIS — F419 Anxiety disorder, unspecified: Secondary | ICD-10-CM | POA: Insufficient documentation

## 2023-10-09 LAB — RUPTURE OF MEMBRANE (ROM)PLUS: Rom Plus: NEGATIVE

## 2023-10-09 NOTE — Discharge Instructions (Signed)
 Reasons to return to MAU at [facility]: More than 36 weeks: You begin to have strong, frequent contractions 5 minutes apart or less, each last 1 minute, these have been going on for 1-2 hours, and you cannot walk or talk during them. Your water breaks.  Sometimes it is a big gush of fluid. However, many times it may it may be much more subtle. You should go to the hospital if you have a constant leakage of fluid from your vagina, enough to soak a pad when you are walking around.  You have vaginal bleeding.  It is normal to have a small amount of spotting if your cervix was checked. If you have bleeding requiring the use of a pad, go to the hospital. You don't feel your baby moving like normal.  If you think that you baby's movement is decreased, eat a snack and rest on your left side in a quiet room for one hour. If you have not felt the baby move more than 6 times in an hour GO TO THE HOSPITAL.

## 2023-10-09 NOTE — MAU Note (Incomplete)
.  Michelle Woodard is a 28 y.o. at [redacted]w[redacted]d here in MAU reporting: noticed a change in vaginal discharger to a more runny consistency around 1030 this morning. Unsure if her water broke and having occasional contraction. Denies vaginal bleeding and endorses FM   Onset of complaint: 10/10/2023 Pain score: 3/10 Vitals:   10/09/23 1753  BP: 116/69  Pulse: 85  Resp: 16  Temp: (!) 96.6 F (35.9 C)  SpO2: 98%     FHT: 130  Lab orders placed from triage: UA
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
## 2023-10-09 NOTE — Progress Notes (Signed)
 36 week swab. No concerns at this time.   Pt states everything is well.

## 2023-10-09 NOTE — Progress Notes (Signed)
   PRENATAL VISIT NOTE  Subjective:  Michelle Woodard is a 28 y.o. G4P1021 at [redacted]w[redacted]d being seen today for ongoing prenatal care.  She is currently monitored for the following issues for this low-risk pregnancy and has Nondisplaced fracture of medial malleolus of left tibia, initial encounter for closed fracture; Nondisplaced avulsion fracture (chip fracture) of left talus, initial encounter for closed fracture; Supervision of other normal pregnancy, antepartum; Rubella non-immune status, antepartum; History of intrauterine growth restriction in prior pregnancy, currently pregnant; and Anxiety during pregnancy, antepartum, third trimester on their problem list.  Patient reports leaking a large amount of clear fluid this morning then nothing since. Reports the consistency was like water, not mucous.  Contractions: Irritability. Vag. Bleeding: None.  Movement: Increased.    The following portions of the patient's history were reviewed and updated as appropriate: allergies, current medications, past family history, past medical history, past social history, past surgical history and problem list.   Objective:   Vitals:   10/09/23 1523  BP: 108/73  Pulse: 93  Weight: 132 lb 8 oz (60.1 kg)   Body mass index is 26.76 kg/m. Total weight gain: 32 lb 8 oz (14.7 kg)   Fetal Status: Fetal Heart Rate (bpm): 131 Fundal Height: 35 cm Movement: Increased     General:  Alert, oriented and cooperative. Patient is in no acute distress.  Skin: Skin is warm and dry. No rash noted.   Cardiovascular: Normal heart rate noted  Respiratory: Normal respiratory effort, no problems with respiration noted  Abdomen: Soft, gravid, appropriate for gestational age.  Pain/Pressure: Present     Pelvic: Pelvic exam performed in the presence of a chaperone  Cervix appears multiparous Scant fluid in vagina, no leak with cough +bright blue nitrazine       Extremities: Normal range of motion.  Edema: None  Mental Status:  Normal mood and affect. Normal behavior. Normal judgment and thought content.   Assessment and Plan:  1. Supervision of other normal pregnancy, antepartum (Primary) Anticipatory guidance Recommend pt go to MAU for r/o ROM given +nitrazine and reports of leaking watery fluid earlier today - Culture, beta strep (group b only) - Cervicovaginal ancillary only( [place])  2. Anxiety during pregnancy, antepartum, third trimester Zoloft  controlling symptoms at current dose per pt  3. [redacted] weeks gestation of pregnancy - Culture, beta strep (group b only) - Cervicovaginal ancillary only( [place])  4. Vaginal discharge during pregnancy in third trimester To MAU for r/o ROM   Preterm labor symptoms and general obstetric precautions including but not limited to vaginal bleeding, contractions, leaking of fluid and fetal movement were reviewed in detail with the patient. Please refer to After Visit Summary for other counseling recommendations.   Return in about 1 week (around 10/16/2023).  No future appointments.  Rollo ONEIDA Bring, MD

## 2023-10-13 LAB — CERVICOVAGINAL ANCILLARY ONLY
Chlamydia: NEGATIVE
Comment: NEGATIVE
Comment: NEGATIVE
Comment: NORMAL
Neisseria Gonorrhea: NEGATIVE
Trichomonas: NEGATIVE

## 2023-10-13 LAB — CULTURE, BETA STREP (GROUP B ONLY): Strep Gp B Culture: NEGATIVE

## 2023-10-15 ENCOUNTER — Ambulatory Visit: Payer: Self-pay | Admitting: Obstetrics and Gynecology

## 2023-10-15 ENCOUNTER — Other Ambulatory Visit: Payer: Self-pay | Admitting: Obstetrics and Gynecology

## 2023-10-15 DIAGNOSIS — F419 Anxiety disorder, unspecified: Secondary | ICD-10-CM

## 2023-10-15 MED ORDER — SERTRALINE HCL 25 MG PO TABS: 25.0000 mg | ORAL_TABLET | Freq: Every day | ORAL | 0 refills | Status: DC

## 2023-10-16 ENCOUNTER — Ambulatory Visit (INDEPENDENT_AMBULATORY_CARE_PROVIDER_SITE_OTHER): Admitting: Obstetrics and Gynecology

## 2023-10-16 VITALS — BP 98/67 | HR 94 | Wt 128.8 lb

## 2023-10-16 DIAGNOSIS — O26899 Other specified pregnancy related conditions, unspecified trimester: Secondary | ICD-10-CM

## 2023-10-16 DIAGNOSIS — O99343 Other mental disorders complicating pregnancy, third trimester: Secondary | ICD-10-CM

## 2023-10-16 DIAGNOSIS — Z3A37 37 weeks gestation of pregnancy: Secondary | ICD-10-CM | POA: Diagnosis not present

## 2023-10-16 DIAGNOSIS — R102 Pelvic and perineal pain: Secondary | ICD-10-CM

## 2023-10-16 DIAGNOSIS — O368130 Decreased fetal movements, third trimester, not applicable or unspecified: Secondary | ICD-10-CM | POA: Diagnosis not present

## 2023-10-16 DIAGNOSIS — F419 Anxiety disorder, unspecified: Secondary | ICD-10-CM

## 2023-10-16 DIAGNOSIS — Z3480 Encounter for supervision of other normal pregnancy, unspecified trimester: Secondary | ICD-10-CM

## 2023-10-16 MED ORDER — SERTRALINE HCL 25 MG PO TABS: 25.0000 mg | ORAL_TABLET | Freq: Every day | ORAL | 11 refills | Status: DC

## 2023-10-16 NOTE — Progress Notes (Signed)
 Pain similar to sciatica nerve but present in both legs up near the groin (scale of 10 out of 10). Been occurring since August 5th.   NST due to report of decreased movement for about a week.

## 2023-10-16 NOTE — Progress Notes (Signed)
   PRENATAL VISIT NOTE  Subjective:  Michelle Woodard is a 28 y.o. G4P1021 at [redacted]w[redacted]d being seen today for ongoing prenatal care.  She is currently monitored for the following issues for this low-risk pregnancy and has Nondisplaced fracture of medial malleolus of left tibia, initial encounter for closed fracture; Nondisplaced avulsion fracture (chip fracture) of left talus, initial encounter for closed fracture; Supervision of other normal pregnancy, antepartum; Rubella non-immune status, antepartum; History of intrauterine growth restriction in prior pregnancy, currently pregnant; and Anxiety during pregnancy, antepartum, third trimester on their problem list.  Patient reports pain in her bilateral groin/pelvis. Finds it very uncomfortable and hard to walk at times.  Contractions: Irritability. Vag. Bleeding: None.  Movement: (!) Decreased. Denies leaking of fluid.   The following portions of the patient's history were reviewed and updated as appropriate: allergies, current medications, past family history, past medical history, past social history, past surgical history and problem list.   Objective:   Vitals:   10/16/23 1031  BP: 98/67  Pulse: 94  Weight: 128 lb 12.8 oz (58.4 kg)   Body mass index is 26.01 kg/m. Total weight gain: 28 lb 12.8 oz (13.1 kg)   Fetal Status: Fetal Heart Rate (bpm): 136   Movement: (!) Decreased     General:  Alert, oriented and cooperative. Patient is in no acute distress.  Skin: Skin is warm and dry. No rash noted.   Cardiovascular: Normal heart rate noted  Respiratory: Normal respiratory effort, no problems with respiration noted  Abdomen: Soft, gravid, appropriate for gestational age.  Pain/Pressure: Present     Pelvic: Cervical exam deferred        Extremities: Normal range of motion.  Edema: None  Mental Status: Normal mood and affect. Normal behavior. Normal judgment and thought content.   NST reactive  Assessment and Plan:  Pregnancy: G4P1021  at [redacted]w[redacted]d 1. Supervision of other normal pregnancy, antepartum (Primary) Anticipatory guidance  2. Decreased fetal movements in third trimester, single or unspecified fetus NST reactive Fetal kick counts reviewed - Fetal nonstress test; Future  3. Pelvic pain affecting pregnancy, antepartum Exam within normal limits, recommend PT - Ambulatory referral to Physical Therapy  4. Anxiety during pregnancy, antepartum, third trimester Refills given, patient comfortable with the dose - sertraline  (ZOLOFT ) 25 MG tablet; Take 1 tablet (25 mg total) by mouth daily.  Dispense: 30 tablet; Refill: 11    Term labor symptoms and general obstetric precautions including but not limited to vaginal bleeding, contractions, leaking of fluid and fetal movement were reviewed in detail with the patient. Please refer to After Visit Summary for other counseling recommendations.   No follow-ups on file.  No future appointments.  Rollo ONEIDA Bring, MD

## 2023-10-20 ENCOUNTER — Ambulatory Visit (INDEPENDENT_AMBULATORY_CARE_PROVIDER_SITE_OTHER): Admitting: Obstetrics and Gynecology

## 2023-10-20 ENCOUNTER — Encounter: Payer: Self-pay | Admitting: Obstetrics and Gynecology

## 2023-10-20 VITALS — BP 101/71 | HR 83 | Wt 128.6 lb

## 2023-10-20 DIAGNOSIS — Z3A37 37 weeks gestation of pregnancy: Secondary | ICD-10-CM | POA: Diagnosis not present

## 2023-10-20 DIAGNOSIS — Z3480 Encounter for supervision of other normal pregnancy, unspecified trimester: Secondary | ICD-10-CM

## 2023-10-20 DIAGNOSIS — O09293 Supervision of pregnancy with other poor reproductive or obstetric history, third trimester: Secondary | ICD-10-CM | POA: Diagnosis not present

## 2023-10-20 DIAGNOSIS — O09299 Supervision of pregnancy with other poor reproductive or obstetric history, unspecified trimester: Secondary | ICD-10-CM | POA: Diagnosis not present

## 2023-10-20 NOTE — Progress Notes (Signed)
 ROB.   Would like to discuss induction with provider.

## 2023-10-20 NOTE — Progress Notes (Signed)
 38  PRENATAL VISIT NOTE  Subjective:  Michelle Woodard is a 28 y.o. G4P1021 at [redacted]w[redacted]d being seen today for ongoing prenatal care.  She is currently monitored for the following issues for this low-risk pregnancy and has Nondisplaced fracture of medial malleolus of left tibia, initial encounter for closed fracture; Nondisplaced avulsion fracture (chip fracture) of left talus, initial encounter for closed fracture; Supervision of other normal pregnancy, antepartum; Rubella non-immune status, antepartum; History of intrauterine growth restriction in prior pregnancy, currently pregnant; and Anxiety during pregnancy, antepartum, third trimester on their problem list.  Patient reports questions about IOL, partner will be in [place] 14th.  Contractions: Irritability. Vag. Bleeding: None.  Movement: Present. Denies leaking of fluid.   The following portions of the patient's history were reviewed and updated as appropriate: allergies, current medications, past family history, past medical history, past social history, past surgical history and problem list.   Objective:    Vitals:   10/20/23 1423  BP: 101/71  Pulse: 83  Weight: 128 lb 9.6 oz (58.3 kg)    Fetal Status:  Fetal Heart Rate (bpm): 139 Fundal Height: 37 cm Movement: Present Presentation: Vertex  General: Alert, oriented and cooperative. Patient is in no acute distress.  Skin: Skin is warm and dry. No rash noted.   Cardiovascular: Normal heart rate noted  Respiratory: Normal respiratory effort, no problems with respiration noted  Abdomen: Soft, gravid, appropriate for gestational age.  Pain/Pressure: Present     Pelvic: Cervical exam performed in the presence of a chaperone Dilation: 2 Effacement (%): 60 Station: -3  Extremities: Normal range of motion.  Edema: None  Mental Status: Normal mood and affect. Normal behavior. Normal judgment and thought content.   Assessment and Plan:  Pregnancy: G4P1021 at [redacted]w[redacted]d 1. Supervision of other  normal pregnancy, antepartum (Primary) BP and FHR normal Doing well, feeling regular movement  FH appropriate  2. [redacted] weeks gestation of pregnancy 2 cm today, discussed labor precautions, discussed IOL and elective IOL. Partner leaving for [place] on 14th. Dicussed elective induction not a guarantee, on book for 9/2, await phone call or go to hospital for labor signs   3. History of intrauterine growth restriction in prior pregnancy, currently pregnant Normal growth to date   Term labor symptoms and general obstetric precautions including but not limited to vaginal bleeding, contractions, leaking of fluid and fetal movement were reviewed in detail with the patient. Please refer to After Visit Summary for other counseling recommendations.      Nidia Daring, FNP

## 2023-10-24 ENCOUNTER — Inpatient Hospital Stay (HOSPITAL_COMMUNITY)
Admission: AD | Admit: 2023-10-24 | Discharge: 2023-10-24 | Disposition: A | Discharge status: Home / Self Care | Attending: Obstetrics & Gynecology | Admitting: Obstetrics & Gynecology

## 2023-10-24 ENCOUNTER — Encounter (HOSPITAL_COMMUNITY): Payer: Self-pay | Admitting: Obstetrics & Gynecology

## 2023-10-24 DIAGNOSIS — O4443 Low lying placenta NOS or without hemorrhage, third trimester: Secondary | ICD-10-CM | POA: Diagnosis not present

## 2023-10-24 DIAGNOSIS — Z3A38 38 weeks gestation of pregnancy: Secondary | ICD-10-CM | POA: Diagnosis not present

## 2023-10-24 DIAGNOSIS — O368130 Decreased fetal movements, third trimester, not applicable or unspecified: Secondary | ICD-10-CM | POA: Diagnosis present

## 2023-10-24 DIAGNOSIS — O4440 Low lying placenta NOS or without hemorrhage, unspecified trimester: Secondary | ICD-10-CM

## 2023-10-24 NOTE — MAU Note (Addendum)
 Pt says was last movement was at 645- but only moved 3-4x today .   And same Wed and Thursday . PNC- [facility]- did not call office . In Triage - FHR- 124. Feels some irreg UC's  Was in office - Monday- VE 2 cm

## 2023-10-24 NOTE — Discharge Instructions (Signed)
 Everything looked fantastic with you and your baby tonight! We are always here in case you have any concerns and would like to be evaluated.  Reasons to return to MAU at [facility]: You begin to have strong, frequent contractions 5 minutes apart or less, each last 1 minute, these have been going on for 1-2 hours, and you cannot walk or talk during them. Your water breaks.  Sometimes it is a big gush of fluid. However, many times it may it may be much more subtle. You should go to the hospital if you have a constant leakage of fluid from your vagina, enough to soak a pad when you are walking around.  You have vaginal bleeding.  It is normal to have a small amount of spotting if your cervix was checked. If you have bleeding requiring the use of a pad, go to the hospital. You don't feel your baby moving like normal.  If you think that you baby's movement is decreased, eat a snack and rest on your left side in a quiet room for one hour. If you have not felt the baby move more than 6 times in an hour GO TO THE HOSPITAL.     What is a fetal movement count?  A fetal movement count  is the number of times that you feel your baby move during a certain amount of time. This may also be called a fetal kick count by some people, but fetal movement count is a more accurate term. Movements can be kicks, flutters, swishes, rolls, or jabs. A fetal movement count is recommended for every pregnant woman. You may be asked to start counting fetal movements as early as week 28 of your pregnancy. Pay attention to when your baby is most active. Your baby has times of activity and times to sleep just like you do! You may notice your baby's sleep and wake cycles. You may also notice things that make your baby move more. You should do a fetal movement count: When your baby is normally most active. At the same time each day. A good time to count movements is while you are resting, after having  something to eat and drink.  How do I count fetal movements? Find a quiet, comfortable area. Sit, or lie down on your side. Write down the date, the start time and stop time, and the number of movements that you felt between those two times. Take this information with you to your health care visits. Write down your start time when you feel the first movement. Count kicks, flutters, swishes, rolls, and jabs. You should feel at least 10 movements. You may stop counting after you have felt 10 movements, or if you have been counting for 2 hours. Write down the stop time. If you do not feel 10 movements in 2 hours, contact your health care provider for further instructions. Your health care provider may want to do additional tests to assess your baby's well-being. Contact a health care provider if: You feel fewer than 10 movements in 2 hours. Your baby is not moving like he or she usually does.
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
## 2023-10-28 ENCOUNTER — Encounter (HOSPITAL_COMMUNITY): Payer: Self-pay | Admitting: Obstetrics and Gynecology

## 2023-10-28 ENCOUNTER — Inpatient Hospital Stay (HOSPITAL_COMMUNITY)
Admission: AD | Admit: 2023-10-28 | Discharge: 2023-10-29 | Disposition: A | Discharge status: Home / Self Care | Attending: Obstetrics and Gynecology | Admitting: Obstetrics and Gynecology

## 2023-10-28 DIAGNOSIS — Z3480 Encounter for supervision of other normal pregnancy, unspecified trimester: Secondary | ICD-10-CM

## 2023-10-28 DIAGNOSIS — Z3A39 39 weeks gestation of pregnancy: Secondary | ICD-10-CM | POA: Diagnosis not present

## 2023-10-28 DIAGNOSIS — O471 False labor at or after 37 completed weeks of gestation: Secondary | ICD-10-CM | POA: Insufficient documentation

## 2023-10-28 NOTE — MAU Note (Signed)
 PT given the option to stay another hour for recheck but she would rather go home.

## 2023-10-28 NOTE — MAU Note (Signed)
 Michelle Woodard is a 28 y.o. at [redacted]w[redacted]d here in MAU reporting ctxs since yesterday which have become stronger. Reports good FM and denies LOF. Had alittle spotting yesterday but not today.   LMP: na Onset of complaint: Monday Pain score: 7 Vitals:   10/28/23 2210  Pulse: 87  Resp: 17  Temp: 97.7 F (36.5 C)  SpO2: 100%     FHT: 140  Lab orders placed from triage: labor eval

## 2023-10-29 ENCOUNTER — Inpatient Hospital Stay (HOSPITAL_COMMUNITY): Admitting: Anesthesiology

## 2023-10-29 ENCOUNTER — Inpatient Hospital Stay (HOSPITAL_COMMUNITY)
Admission: AD | Admit: 2023-10-29 | Discharge: 2023-10-30 | DRG: 806 | Disposition: A | Discharge status: Home / Self Care | Attending: Obstetrics and Gynecology | Admitting: Obstetrics and Gynecology

## 2023-10-29 ENCOUNTER — Encounter (HOSPITAL_COMMUNITY): Payer: Self-pay | Admitting: Obstetrics and Gynecology

## 2023-10-29 ENCOUNTER — Other Ambulatory Visit: Payer: Self-pay

## 2023-10-29 DIAGNOSIS — Z87891 Personal history of nicotine dependence: Secondary | ICD-10-CM | POA: Diagnosis not present

## 2023-10-29 DIAGNOSIS — Z3A39 39 weeks gestation of pregnancy: Secondary | ICD-10-CM

## 2023-10-29 DIAGNOSIS — Z79899 Other long term (current) drug therapy: Secondary | ICD-10-CM | POA: Diagnosis not present

## 2023-10-29 DIAGNOSIS — Z833 Family history of diabetes mellitus: Secondary | ICD-10-CM

## 2023-10-29 DIAGNOSIS — O864 Pyrexia of unknown origin following delivery: Secondary | ICD-10-CM | POA: Diagnosis not present

## 2023-10-29 DIAGNOSIS — O26893 Other specified pregnancy related conditions, third trimester: Secondary | ICD-10-CM | POA: Diagnosis present

## 2023-10-29 DIAGNOSIS — O99344 Other mental disorders complicating childbirth: Secondary | ICD-10-CM

## 2023-10-29 LAB — TYPE AND SCREEN
ABO/RH(D): O POS
Antibody Screen: NEGATIVE

## 2023-10-29 LAB — CBC
HCT: 36.3 % (ref 36.0–46.0)
Hemoglobin: 11.6 g/dL — ABNORMAL LOW (ref 12.0–15.0)
MCH: 25.7 pg — ABNORMAL LOW (ref 26.0–34.0)
MCHC: 32 g/dL (ref 30.0–36.0)
MCV: 80.5 fL (ref 80.0–100.0)
Platelets: 236 K/uL (ref 150–400)
RBC: 4.51 MIL/uL (ref 3.87–5.11)
RDW: 13.6 % (ref 11.5–15.5)
WBC: 13.2 K/uL — ABNORMAL HIGH (ref 4.0–10.5)
nRBC: 0 % (ref 0.0–0.2)

## 2023-10-29 LAB — RPR: RPR Ser Ql: NONREACTIVE

## 2023-10-29 MED ORDER — EPHEDRINE 5 MG/ML INJ: 10.0000 mg | INTRAVENOUS | Status: DC | PRN

## 2023-10-29 MED ORDER — DIPHENHYDRAMINE HCL 50 MG/ML IJ SOLN: 12.5000 mg | INTRAMUSCULAR | Status: DC | PRN

## 2023-10-29 MED ORDER — BUPIVACAINE HCL (PF) 0.25 % IJ SOLN
INTRAMUSCULAR | Status: DC | PRN
  Administered 2023-10-29: 1 mL via INTRATHECAL

## 2023-10-29 MED ORDER — ACETAMINOPHEN 325 MG PO TABS: 650.0000 mg | ORAL_TABLET | ORAL | Status: DC | PRN

## 2023-10-29 MED ORDER — OXYTOCIN-SODIUM CHLORIDE 30-0.9 UT/500ML-% IV SOLN
2.5000 [IU]/h | INTRAVENOUS | Status: DC
  Administered 2023-10-29: 2.5 [IU]/h via INTRAVENOUS
  Filled 2023-10-29: qty 500

## 2023-10-29 MED ORDER — DIBUCAINE (PERIANAL) 1 % EX OINT
1.0000 | TOPICAL_OINTMENT | CUTANEOUS | Status: DC | PRN
Start: 2023-10-29 — End: 2023-10-30

## 2023-10-29 MED ORDER — MEASLES, MUMPS & RUBELLA VAC IJ SOLR: 0.5000 mL | Freq: Once | INTRAMUSCULAR | Status: DC

## 2023-10-29 MED ORDER — MAGNESIUM HYDROXIDE 400 MG/5ML PO SUSP: 30.0000 mL | Freq: Every day | ORAL | Status: DC | PRN

## 2023-10-29 MED ORDER — TRANEXAMIC ACID-NACL 1000-0.7 MG/100ML-% IV SOLN
1000.0000 mg | Freq: Once | INTRAVENOUS | Status: AC
  Administered 2023-10-29: 1000 mg via INTRAVENOUS

## 2023-10-29 MED ORDER — LIDOCAINE HCL (PF) 1 % IJ SOLN
INTRAMUSCULAR | Status: AC
  Filled 2023-10-29: qty 30

## 2023-10-29 MED ORDER — ONDANSETRON HCL 4 MG PO TABS: 4.0000 mg | ORAL_TABLET | ORAL | Status: DC | PRN

## 2023-10-29 MED ORDER — ONDANSETRON HCL 4 MG/2ML IJ SOLN: 4.0000 mg | Freq: Four times a day (QID) | INTRAMUSCULAR | Status: DC | PRN

## 2023-10-29 MED ORDER — LIDOCAINE HCL (PF) 1 % IJ SOLN
INTRAMUSCULAR | Status: DC | PRN
  Administered 2023-10-29: 2 mL via SUBCUTANEOUS
  Administered 2023-10-29: 3 mL via SUBCUTANEOUS
  Administered 2023-10-29: 2 mL via SUBCUTANEOUS
  Administered 2023-10-29: 3 mL via SUBCUTANEOUS

## 2023-10-29 MED ORDER — PHENYLEPHRINE 80 MCG/ML (10ML) SYRINGE FOR IV PUSH (FOR BLOOD PRESSURE SUPPORT): 80.0000 ug | PREFILLED_SYRINGE | INTRAVENOUS | Status: DC | PRN

## 2023-10-29 MED ORDER — LACTATED RINGERS IV SOLN: INTRAVENOUS | Status: DC

## 2023-10-29 MED ORDER — SIMETHICONE 80 MG PO CHEW: 80.0000 mg | CHEWABLE_TABLET | ORAL | Status: DC | PRN

## 2023-10-29 MED ORDER — PRENATAL MULTIVITAMIN CH
ORAL_TABLET | Freq: Every day | ORAL | Status: DC
  Administered 2023-10-30: 1 via ORAL
  Filled 2023-10-29: qty 1

## 2023-10-29 MED ORDER — IBUPROFEN 600 MG PO TABS
600.0000 mg | ORAL_TABLET | Freq: Four times a day (QID) | ORAL | Status: DC
  Administered 2023-10-29 – 2023-10-30 (×4): 600 mg via ORAL
  Filled 2023-10-29 (×5): qty 1

## 2023-10-29 MED ORDER — ACETAMINOPHEN 325 MG PO TABS
650.0000 mg | ORAL_TABLET | ORAL | Status: DC | PRN
  Administered 2023-10-29: 650 mg via ORAL
  Filled 2023-10-29: qty 2

## 2023-10-29 MED ORDER — FERROUS SULFATE 325 (65 FE) MG PO TABS
325.0000 mg | ORAL_TABLET | Freq: Three times a day (TID) | ORAL | Status: DC
  Administered 2023-10-29 – 2023-10-30 (×3): 325 mg via ORAL
  Filled 2023-10-29 (×4): qty 1

## 2023-10-29 MED ORDER — OXYTOCIN 10 UNIT/ML IJ SOLN
INTRAMUSCULAR | Status: AC
  Filled 2023-10-29: qty 1

## 2023-10-29 MED ORDER — LIDOCAINE-EPINEPHRINE (PF) 2 %-1:200000 IJ SOLN
INTRAMUSCULAR | Status: DC | PRN
  Administered 2023-10-29: 3 mL via EPIDURAL

## 2023-10-29 MED ORDER — FLEET ENEMA RE ENEM: 1.0000 | ENEMA | RECTAL | Status: DC | PRN

## 2023-10-29 MED ORDER — SERTRALINE HCL 25 MG PO TABS
25.0000 mg | ORAL_TABLET | Freq: Every day | ORAL | Status: DC
  Administered 2023-10-29 – 2023-10-30 (×2): 25 mg via ORAL
  Filled 2023-10-29 (×2): qty 1

## 2023-10-29 MED ORDER — DIPHENHYDRAMINE HCL 25 MG PO CAPS: 25.0000 mg | ORAL_CAPSULE | Freq: Four times a day (QID) | ORAL | Status: DC | PRN

## 2023-10-29 MED ORDER — COCONUT OIL OIL: 1.0000 | TOPICAL_OIL | Status: DC | PRN

## 2023-10-29 MED ORDER — OXYCODONE-ACETAMINOPHEN 5-325 MG PO TABS: 1.0000 | ORAL_TABLET | ORAL | Status: DC | PRN

## 2023-10-29 MED ORDER — BENZOCAINE-MENTHOL 20-0.5 % EX AERO: 1.0000 | INHALATION_SPRAY | CUTANEOUS | Status: DC | PRN

## 2023-10-29 MED ORDER — WITCH HAZEL-GLYCERIN EX PADS
1.0000 | MEDICATED_PAD | CUTANEOUS | Status: DC | PRN
Start: 2023-10-29 — End: 2023-10-30

## 2023-10-29 MED ORDER — TETANUS-DIPHTH-ACELL PERTUSSIS 5-2.5-18.5 LF-MCG/0.5 IM SUSY: 0.5000 mL | PREFILLED_SYRINGE | Freq: Once | INTRAMUSCULAR | Status: DC

## 2023-10-29 MED ORDER — TRANEXAMIC ACID-NACL 1000-0.7 MG/100ML-% IV SOLN
INTRAVENOUS | Status: AC
  Filled 2023-10-29: qty 100

## 2023-10-29 MED ORDER — TRANEXAMIC ACID-NACL 1000-0.7 MG/100ML-% IV SOLN: 1000.0000 mg | INTRAVENOUS | Status: DC

## 2023-10-29 MED ORDER — LACTATED RINGERS IV SOLN
500.0000 mL | Freq: Once | INTRAVENOUS | Status: AC
  Administered 2023-10-29: 500 mL via INTRAVENOUS

## 2023-10-29 MED ORDER — OXYCODONE-ACETAMINOPHEN 5-325 MG PO TABS: 2.0000 | ORAL_TABLET | ORAL | Status: DC | PRN

## 2023-10-29 MED ORDER — LACTATED RINGERS IV SOLN: 500.0000 mL | INTRAVENOUS | Status: DC | PRN

## 2023-10-29 MED ORDER — SENNOSIDES-DOCUSATE SODIUM 8.6-50 MG PO TABS
2.0000 | ORAL_TABLET | Freq: Every day | ORAL | Status: DC
  Administered 2023-10-30: 2 via ORAL
  Filled 2023-10-29: qty 2

## 2023-10-29 MED ORDER — ONDANSETRON HCL 4 MG/2ML IJ SOLN: 4.0000 mg | INTRAMUSCULAR | Status: DC | PRN

## 2023-10-29 MED ORDER — FENTANYL-BUPIVACAINE-NACL 0.5-0.125-0.9 MG/250ML-% EP SOLN
9.0000 mL/h | EPIDURAL | Status: DC | PRN
  Administered 2023-10-29: 9 mL/h via EPIDURAL
  Filled 2023-10-29: qty 250

## 2023-10-29 MED ORDER — LIDOCAINE HCL (PF) 1 % IJ SOLN
30.0000 mL | INTRAMUSCULAR | Status: DC | PRN
Start: 2023-10-29 — End: 2023-10-29

## 2023-10-29 MED ORDER — OXYTOCIN BOLUS FROM INFUSION
333.0000 mL | Freq: Once | INTRAVENOUS | Status: AC
  Administered 2023-10-29: 333 mL via INTRAVENOUS

## 2023-10-29 MED ORDER — SOD CITRATE-CITRIC ACID 500-334 MG/5ML PO SOLN: 30.0000 mL | ORAL | Status: DC | PRN

## 2023-10-29 MED ORDER — PHENYLEPHRINE 80 MCG/ML (10ML) SYRINGE FOR IV PUSH (FOR BLOOD PRESSURE SUPPORT)
80.0000 ug | PREFILLED_SYRINGE | INTRAVENOUS | Status: DC | PRN
  Filled 2023-10-29: qty 10

## 2023-10-29 NOTE — MAU Note (Signed)
 MAU Labor Triage Note:  .Michelle Woodard is a 28 y.o. at [redacted]w[redacted]d here in MAU reporting: worsening contractions since being discharged from MAU earlier tonight.   Contractions every: 2 minutes Onset of ctx: on-going Pain Score: 10-Worst pain ever Pain Location: Abdomen  ROM: patient unsure Vaginal Bleeding: denies Last SVE: 4 cm earlier tonight Labor Pain Management Plan: Planning epidural  GBS: Negative  Fetal Movement: Reports positive FM FHT: Fetal Heart Rate Mode: External Baseline Rate (A): 140 bpm  Lab orders placed from triage: MAU Labor Eval OB Office: [facility]

## 2023-10-29 NOTE — Discharge Summary (Signed)
 Postpartum Discharge Summary  Date of Service updated***     Patient Name: Michelle Woodard DOB: January 13, 1996 MRN: [id number]  Date of admission: 10/29/2023 Delivery date:10/29/2023 Delivering provider: DANNY GERALDS Date of discharge: 10/29/2023  Admitting diagnosis: Normal labor [O80, Z37.9] Intrauterine pregnancy: [redacted]w[redacted]d     Secondary diagnosis:  Principal Problem:   Normal labor  Additional problems: ***    Discharge diagnosis: {DX.:23714}                                              Post partum procedures:{Postpartum procedures:23558} Augmentation: AROM Complications: None maternal fever immediately postpartum   Hospital course: Onset of Labor With Vaginal Delivery      28 y.o. yo H5E7977 at [redacted]w[redacted]d was admitted in Latent Labor on 10/29/2023. Labor course was complicated by fever immediately PP Membrane Rupture Time/Date: 4:46 AM,10/29/2023  Delivery Method:Vaginal, Spontaneous Operative Delivery:N/A Episiotomy: None Lacerations:  1st degree;Vaginal  Patient had a postpartum course complicated by ***.  She is ambulating, tolerating a regular diet, passing flatus, and urinating well. Patient is discharged home in stable condition on 10/29/23.  Newborn Data: Birth date:10/29/2023 Birth time:8:15 AM Gender:Female Living status:Living Apgars:9 ,9  Weight:3260 g  Magnesium  Sulfate received: {Mag received:30440022} BMZ received: No Rhophylac:N/A MMR:{MMR:30440033} needs*** T-DaP:{Tdap:23962} last 2023 Flu: No RSV Vaccine received: No Transfusion:{Transfusion received:30440034}  Immunizations received: Immunization History  Administered Date(s) Administered   DTaP 10/22/1996, 01/25/1997, 06/13/1997, 02/13/1998   HIB, Unspecified 01/25/1997, 06/13/1997, 10/22/1997, 02/13/1998   Hepatitis B, PED/ADOLESCENT 03-21-1996, 10/22/1996, 01/25/1997   MMR 02/13/1998   OPV 10/22/1996, 01/25/1997, 06/13/1997   Tdap 07/27/2007, 09/21/2021   Varicella 02/13/1998    Physical exam   Vitals:   10/29/23 0522 10/29/23 0531 10/29/23 0601 10/29/23 0632  BP: (!) 85/48 (!) 95/48 (!) 94/57 98/69  Pulse: 69 (!) 53 (!) 55 86  SpO2:       General: {Exam; general:21111117} Lochia: {Desc; appropriate/inappropriate:30686::appropriate} Uterine Fundus: {Desc; firm/soft:30687} Incision: {Exam; incision:21111123} DVT Evaluation: {Exam; dvt:2111122} Labs: Lab Results  Component Value Date   WBC 13.2 (H) 10/29/2023   HGB 11.6 (L) 10/29/2023   HCT 36.3 10/29/2023   MCV 80.5 10/29/2023   PLT 236 10/29/2023      Latest Ref Rng & Units 02/22/2019    3:46 AM  CMP  Glucose 70 - 99 mg/dL 887   BUN 6 - 20 mg/dL 5   Creatinine 9.55 - 8.99 mg/dL 9.52   Sodium 864 - 854 mmol/L 138   Potassium 3.5 - 5.1 mmol/L 3.2   Chloride 98 - 111 mmol/L 105   CO2 22 - 32 mmol/L 24   Calcium 8.9 - 10.3 mg/dL 8.8    Edinburgh Score:    12/26/2021    7:29 PM  Edinburgh Postnatal Depression Scale Screening Tool  I have been able to laugh and see the funny side of things. 0  I have looked forward with enjoyment to things. 0  I have blamed myself unnecessarily when things went wrong. 2  I have been anxious or worried for no good reason. 2  I have felt scared or panicky for no good reason. 2  Things have been getting on top of me. 0  I have been so unhappy that I have had difficulty sleeping. 0  I have felt sad or miserable. 0  I have been so unhappy that I have been crying.  0  The thought of harming myself has occurred to me. 0  Edinburgh Postnatal Depression Scale Total 6      Data saved with a previous flowsheet row definition   No data recorded  After visit meds:  Allergies as of 10/29/2023   No Known Allergies   Med Rec must be completed prior to using this [facility]***        Discharge home in stable condition Infant Feeding: {Baby feeding:23562} Infant Disposition:{CHL IP OB HOME WITH FNUYZM:76418} Discharge instruction: per After Visit Summary and Postpartum  booklet. Activity: Advance as tolerated. Pelvic rest for 6 weeks.  Diet: {OB ipzu:78888878} Future Appointments: Future Appointments  Date Time Provider Department Center  10/31/2023 10:55 AM Zina Jerilynn LABOR, MD [facility] None   Follow up Visit: Message sent 10/29/2023   Please schedule this patient for a In person postpartum visit in 4 weeks with the following provider: Any provider. Additional Postpartum F/U:Postpartum Depression checkup anxiety Low risk pregnancy complicated by: RNI, anxiety Delivery mode:  Vaginal, Spontaneous Anticipated Birth Control:  Unsure   10/29/2023 Barabara Maier, DO

## 2023-10-29 NOTE — MAU Provider Note (Signed)
 28 yo G4P1021 at [redacted]w[redacted]d here for labor evaluation by RN  No cervical change made after 1 hour.  Cx 4/80/-1 per RN  FHT: baseline 120, mod variability, +accels, no decels Toco: irregular contractions  Patient offered to be rechecked in an hour and opted to be discharged home Fetal status category 1

## 2023-10-29 NOTE — Anesthesia Procedure Notes (Addendum)
 Epidural Patient location during procedure: OB Start time: 10/29/2023 3:35 AM End time: 10/29/2023 3:45 AM  Staffing Anesthesiologist: Boone Fess, MD Performed: anesthesiologist   Preanesthetic Checklist Completed: patient identified, IV checked, site marked, risks and benefits discussed, surgical consent, monitors and equipment checked, pre-op evaluation and timeout performed  Epidural Patient position: sitting Prep: ChloraPrep Patient monitoring: heart rate, continuous pulse ox and blood pressure Approach: midline Location: L3-L4 Injection technique: LOR saline  Needle:  Needle type: Tuohy  Needle gauge: 17 G Needle length: 9 cm  Additional Notes Aborted procedure due to patient discomfort/request.  Pt. Evaluated and documentation done after procedure finished. Patient identified. Risks/Benefits/Options discussed with patient including but not limited to bleeding, infection, nerve damage, paralysis, failed block, incomplete pain control, headache, blood pressure changes, nausea, vomiting, reactions to medication both or allergic, itching and postpartum back pain. Confirmed with bedside nurse the patient's most recent platelet count. Confirmed with patient that they are not currently taking any anticoagulation, have any bleeding history or any family history of bleeding disorders. Patient expressed understanding and wished to proceed. All questions were answered. Sterile technique was used throughout the entire procedure. Please see nursing notes for vital signs.  Patient with intense contraction pain throughout process, limiting optimal positioning. Difficult to locate interspinous spaces with Tuohy. Patient expressed concern that her last epidural did not take so long or hurt as much, and wished for me to abort the procedure, which I did. I reminded her this is an elective procedure and she has full control over her choice to have it or not. I also advised I would be glad to reattempt  at any time if she wishes.Reason for block:procedure for pain

## 2023-10-29 NOTE — Progress Notes (Addendum)
 Labor Progress Note Michelle Woodard is a 28 y.o. G4P1021 at [redacted]w[redacted]d presenting for SOL  S: Now comfortable with epidural. Requesting AROM  O:  LMP 01/08/2023 (Approximate)  Lab Results  Component Value Date   HGB 11.6 (L) 10/29/2023    Time: 4:50 AM  FHT: baseline bpm 130, moderate variability, accelerations present, decelerations none,   Contractions: q every 3 min mins,    CVE: Dilation: 8 Effacement (%): 90 Station: 0 Presentation: Vertex Exam by:: Tasman Zapata MD AROM with clear fluid   A&P: 28 y.o. H5E8978 [redacted]w[redacted]d admitted for SOL #Labor: Active Labor AROM  #Pain: Epidural #FWB: Category I #GBS negative Anxiety, Rubella NI (chronic/other problems)  Leeroy KATHEE Pouch, MD 4:50 AM

## 2023-10-29 NOTE — Anesthesia Procedure Notes (Signed)
 Epidural Patient location during procedure: OB Start time: 10/29/2023 4:20 AM End time: 10/29/2023 4:31 AM  Staffing Anesthesiologist: Boone Fess, MD Performed: anesthesiologist   Preanesthetic Checklist Completed: patient identified, IV checked, site marked, risks and benefits discussed, surgical consent, monitors and equipment checked, pre-op evaluation and timeout performed  Epidural Patient position: sitting Prep: ChloraPrep Patient monitoring: heart rate, continuous pulse ox and blood pressure Approach: midline Location: L3-L4 Injection technique: LOR saline  Needle:  Needle type: Tuohy  Needle gauge: 17 G Needle length: 9 cm Needle insertion depth: 6 cm Catheter type: closed end flexible Catheter size: 19 Gauge Catheter at skin depth: 11 cm Test dose: negative and 1.5% lidocaine  with Epi 1:200 K  Assessment Sensory level: T10 Events: blood not aspirated, no cerebrospinal fluid, injection not painful, no injection resistance, no paresthesia and negative IV test  Additional Notes Second attempt at epidural; Two entry sites. Difficult to locate interspinous spaces.  Pt. Evaluated and documentation done after procedure finished. Patient identified. Risks/Benefits/Options discussed with patient including but not limited to bleeding, infection, nerve damage, paralysis, failed block, incomplete pain control, headache, blood pressure changes, nausea, vomiting, reactions to medication both or allergic, itching and postpartum back pain. Confirmed with bedside nurse the patient's most recent platelet count. Confirmed with patient that they are not currently taking any anticoagulation, have any bleeding history or any family history of bleeding disorders. Patient expressed understanding and wished to proceed. All questions were answered.  Sterile technique was used throughout the entire procedure. Please see nursing notes for vital signs. Local anesthetic skin weal injected, loss of  resistance to saline obtained, no evident dural puncture CSF or blood. Then, a 25g spinal needle was passed through the epidural, with clear CSF return. Medication given intrathecally as documented. Spinal needle was removed, and then epidural catheter threaded.Test dose was given through epidural catheter and negative prior to continuing to dose epidural or start infusion. Warning signs of high block given to the patient including shortness of breath, tingling/numbness in hands, complete motor block, or any concerning symptoms with instructions to call for help. Patient was given instructions on fall risk and not to get out of bed. All questions and concerns addressed with instructions to call with any issues or inadequate analgesia.    Patient tolerated the insertion well without immediate complications. Reason for block:procedure for pain
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
## 2023-10-29 NOTE — Progress Notes (Addendum)
 Written and verbal d/c instructions given and understanding voiced. Gave good instructions of labor precautions and when to return. Pt has appt later this wk but if cont to contract in AM may call office to be seen tomorrow

## 2023-10-29 NOTE — H&P (Signed)
 OBSTETRIC ADMISSION HISTORY AND PHYSICAL  Michelle Woodard is a 28 y.o. female [phone] with IUP at [redacted]w[redacted]d by 9wk US  presenting for SOOL. She reports +FMs, No LOF, no VB, no blurry vision, headaches or peripheral edema, and RUQ pain.  She plans on breast feeding. She is undecided for birth control. She received her prenatal care at [facility]   Dating: By 9wk US  --->  Estimated Date of Delivery: 11/04/23  Sono:    @[redacted]w[redacted]d , CWD, normal anatomy, vertex presentation, anterior placenta, 1712g, 78% EFW   Prenatal History/Complications: anxiety, rubella NI  Past Medical History: Past Medical History:  Diagnosis Date   Anemia    Anxiety    Cervical compression fracture (HCC)    C6, C7   Nondisplaced avulsion fracture (chip fracture) of left talus, initial encounter for closed fracture 02/21/2019   Nondisplaced fracture of medial malleolus of left tibia, initial encounter for closed fracture 02/21/2019    Past Surgical History: Past Surgical History:  Procedure Laterality Date   NO PAST SURGERIES      Obstetrical History: OB History     Gravida  4   Para  1   Term  1   Preterm  0   AB  2   Living  1      SAB  0   IAB  1   Ectopic  1   Multiple  0   Live Births  1           Social History Social History   Socioeconomic History   Marital status: Single    Spouse name: Not on file   Number of children: Not on file   Years of education: Not on file   Highest education level: Not on file  Occupational History   Not on file  Tobacco Use   Smoking status: Former    Current packs/day: 0.00    Types: Cigarettes    Quit date: 2022    Years since quitting: 3.6   Smokeless tobacco: Never  Vaping Use   Vaping status: Former  Substance and Sexual Activity   Alcohol use: Not Currently   Drug use: Not Currently    Comment: xanax and percocet 10 years ago   Sexual activity: Yes    Birth control/protection: None  Other Topics Concern   Not on file  Social  History Narrative   Not on file   Social Drivers of Health   Financial Resource Strain: Not on file  Food Insecurity: Not on file  Transportation Needs: Not on file  Physical Activity: Not on file  Stress: Not on file  Social Connections: Not on file    Family History: Family History  Problem Relation Age of Onset   Healthy Mother    Diabetes Maternal Grandfather    Cancer Maternal Grandfather    Asthma Neg Hx    Birth defects Neg Hx    Heart disease Neg Hx    Hypertension Neg Hx    Stroke Neg Hx     Allergies: No Known Allergies  Medications Prior to Admission  Medication Sig Dispense Refill Last Dose/Taking   ferrous sulfate  325 (65 FE) MG EC tablet Take 325 mg by mouth 3 (three) times daily with meals.      Prenatal Vit-Fe Fumarate-FA (PRENATAL VITAMIN PO) Take 1 tablet by mouth daily.      sertraline  (ZOLOFT ) 25 MG tablet Take 1 tablet (25 mg total) by mouth daily. 30 tablet 11      Review  of Systems   All systems reviewed and negative except as stated in HPI  Last menstrual period 01/08/2023, unknown if currently breastfeeding. General appearance: alert and cooperative, in distress with contractions Lungs: clear to auscultation bilaterally Heart: regular rate and rhythm Abdomen: gravid uterus Pelvic: 8/80/-1, fetal head palpated Presentation: cephalic Fetal monitoringBaseline: 120 bpm, Variability: Good {> 6 bpm), Accelerations: Reactive, and Decelerations: Absent Uterine activityFrequency: Every 2 minutes Dilation: 8 Effacement (%): 90 Exam by:: Adrien Broccoli, RN   Prenatal labs: ABO, Rh: O/Positive/-- (03/06 1331) Antibody: Negative (03/06 1331) Rubella: <0.90 (03/06 1331) RPR: Non Reactive (07/17 0906)  HBsAg: Negative (03/06 1331)  HIV: Non Reactive (07/17 0906)  GBS: Negative/-- (08/14 1557)    Lab Results  Component Value Date   GBS Negative 10/09/2023   GTT passed Genetic screening  low risk panorama, Horizon: neg, AFP negative Anatomy  US  normal  Immunization History  Administered Date(s) Administered   DTaP 10/22/1996, 01/25/1997, 06/13/1997, 02/13/1998   HIB, Unspecified 01/25/1997, 06/13/1997, 10/22/1997, 02/13/1998   Hepatitis B, PED/ADOLESCENT 02/03/1997, 10/22/1996, 01/25/1997   MMR 02/13/1998   OPV 10/22/1996, 01/25/1997, 06/13/1997   Tdap 07/27/2007, 09/21/2021   Varicella 02/13/1998    Prenatal Transfer Tool  Maternal Diabetes: No Genetic Screening: Normal Maternal Ultrasounds/Referrals: Normal Fetal Ultrasounds or other Referrals:  None Maternal Substance Abuse:  No Significant Maternal Medications:  Meds include: Zoloft  Significant Maternal Lab Results: Group B Strep negative Number of Prenatal Visits:greater than 3 verified prenatal visits Maternal Vaccinations: Rubella NI, needs Tdap   Results for orders placed or performed during the hospital encounter of 10/29/23 (from the past 24 hours)  Type and screen [facility]   Collection Time: 10/29/23  2:41 AM  Result Value Ref Range   ABO/RH(D) PENDING    Antibody Screen PENDING    Sample Expiration      11/01/2023,2359 Performed at [facility], [address] [place], [place] [address]     Patient Active Problem List   Diagnosis Date Noted   Anxiety during pregnancy, antepartum, third trimester 10/09/2023   History of intrauterine growth restriction in prior pregnancy, currently pregnant 07/01/2023   Rubella non-immune status, antepartum 05/25/2023   Supervision of other normal pregnancy, antepartum 03/25/2023   Nondisplaced fracture of medial malleolus of left tibia, initial encounter for closed fracture 02/21/2019   Nondisplaced avulsion fracture (chip fracture) of left talus, initial encounter for closed fracture 02/21/2019    Assessment/Plan:  Michelle Woodard is a 28 y.o. G4P1021 at [redacted]w[redacted]d here for SOL  #Labor:SOL #Pain: Epidural, Nitrous Oxide #FWB: Cat I #GBS status:  negative #Feeding: Breastmilk   #Reproductive Life planning: Undecided #Circ:  yes  Leeroy KATHEE Pouch, MD  10/29/2023, 2:45 AM

## 2023-10-29 NOTE — MAU Note (Signed)
 RN called main lab to inquire if amount of blood in lavender tube was enough to run CBC. Lab tech informed RN that it is not enough blood and will need additional collection.

## 2023-10-30 LAB — CBC
HCT: 29 % — ABNORMAL LOW (ref 36.0–46.0)
Hemoglobin: 9.3 g/dL — ABNORMAL LOW (ref 12.0–15.0)
MCH: 25.6 pg — ABNORMAL LOW (ref 26.0–34.0)
MCHC: 32.1 g/dL (ref 30.0–36.0)
MCV: 79.9 fL — ABNORMAL LOW (ref 80.0–100.0)
Platelets: 213 K/uL (ref 150–400)
RBC: 3.63 MIL/uL — ABNORMAL LOW (ref 3.87–5.11)
RDW: 13.7 % (ref 11.5–15.5)
WBC: 15.4 K/uL — ABNORMAL HIGH (ref 4.0–10.5)
nRBC: 0 % (ref 0.0–0.2)

## 2023-10-30 MED ORDER — PRENATAL MULTIVITAMIN CH: 1.0000 | ORAL_TABLET | Freq: Every day | ORAL | 2 refills | Status: AC

## 2023-10-30 MED ORDER — FERROUS SULFATE 325 (65 FE) MG PO TABS
325.0000 mg | ORAL_TABLET | ORAL | 3 refills | Status: AC
Start: 2023-10-30 — End: ?

## 2023-10-30 MED ORDER — ACETAMINOPHEN 325 MG PO TABS: 650.0000 mg | ORAL_TABLET | ORAL | 0 refills | Status: AC | PRN

## 2023-10-30 MED ORDER — IBUPROFEN 600 MG PO TABS: 600.0000 mg | ORAL_TABLET | Freq: Four times a day (QID) | ORAL | 0 refills | Status: AC

## 2023-10-30 NOTE — Patient Instructions (Signed)

## 2023-10-30 NOTE — Social Work (Signed)
 MOB was referred for history of depression/anxiety.  * Referral screened out by Clinical Social Worker because none of the following criteria appear to apply:  ~ History of anxiety/depression during this pregnancy, or of post-partum depression following prior delivery.  ~ Diagnosis of anxiety and/or depression within last 3 years OR * MOB's symptoms currently being treated with medication and/or therapy. Per chart review MOB has an active prescription for Zoloft  25mg .  Please contact the Clinical Social Worker if needs arise, or by MOB request.   Eliazar Gave, LCSWA Clinical Social Worker [phone] ]

## 2023-10-30 NOTE — Anesthesia Postprocedure Evaluation (Signed)
 Anesthesia Post Note  Patient: Michelle Woodard  Procedure(s) Performed: AN AD HOC LABOR EPIDURAL     Patient location during evaluation: Mother Baby Anesthesia Type: Epidural Level of consciousness: awake Pain management: satisfactory to patient Vital Signs Assessment: post-procedure vital signs reviewed and stable Respiratory status: spontaneous breathing Cardiovascular status: stable Anesthetic complications: no   No notable events documented.  Last Vitals:  Vitals:   10/29/23 2006 10/30/23 0459  BP: 102/62 112/80  Pulse: 68 64  Resp: 18 18  Temp: 36.7 C 36.8 C  SpO2: 100% 100%    Last Pain:  Vitals:   10/30/23 0735  TempSrc:   PainSc: 0-No pain   Pain Goal:                   [employer]

## 2023-10-30 NOTE — Lactation Note (Signed)
 This note was copied from a baby's chart. Lactation Consultation Note  Patient Name: Michelle Woodard Unijb'd Date: 10/30/2023 Age:27 hours Reason for consult: Initial assessment;Term  P2- LC provided MOB with her STORK pump.  Maternal Data Has patient been taught Hand Expression?: No Does the patient have breastfeeding experience prior to this delivery?: Yes How long did the patient breastfeed?: 10 months  Feeding Mother's Current Feeding Choice: Breast Milk  LATCH Score Latch: Grasps breast easily, tongue down, lips flanged, rhythmical sucking.  Audible Swallowing: Spontaneous and intermittent  Type of Nipple: Everted at rest and after stimulation  Comfort (Breast/Nipple): Soft / non-tender  Hold (Positioning): No assistance needed to correctly position infant at breast.  LATCH Score: 10   Lactation Tools Discussed/Used Tools: Flanges Flange Size: 21 Pump Education: Milk Storage  Interventions Interventions: Breast feeding basics reviewed;Education;LC Services brochure  Discharge Discharge Education: Engorgement and breast care;Warning signs for feeding baby Pump: Received [facility] Pump  Consult Status Consult Status: Complete    Recardo Hoit BS, IBCLC 10/30/2023, 10:50 AM

## 2023-10-30 NOTE — Lactation Note (Signed)
 This note was copied from a baby's chart. Lactation Consultation Note  Patient Name: Michelle Woodard Date: 10/30/2023 Age:27 hours Reason for consult: Initial assessment;Term  P2- MOB reports that infant has been latching great, but she is cramping a lot and infant is wanting to eat all the time. LC reviewed how since this is her second baby, her body has to work harder to bring the uterus back into shape. This is why the cramping is so intense. LC encouraged MOB to urinate before latching, take pain meds that are offered and to use heat packs for the cramps. LC also reviewed how infant is now in the cluster feeding phase of life, so it is normal for infant to want to eat all the time. LC reviewed engorgement and nipple care. MOB had infant latched to the right breast in the cradle hold. Infant had flanged lips and a strong rhythmic suck. LC praised MOB and infant.   LC reviewed cluster feeding, feeding infant on cue 8-12x in 24 hrs, not allowing infant to go over 3 hrs without a feeding, CDC milk storage guidelines, LC services handout, engorgement/breast care, flange fitting, STORK pump and expected sleepiness after circumcision. LC encouraged MOB to call for further assistance as needed. LC sent STORK referral today. MOB is okay with coming back to hospital to pick up pump if discharged before it's arrival. RN informed.  Maternal Data Has patient been taught Hand Expression?: No Does the patient have breastfeeding experience prior to this delivery?: Yes How long did the patient breastfeed?: 10 months  Feeding Mother's Current Feeding Choice: Breast Milk Nipple Type: Slow - flow  LATCH Score Latch: Grasps breast easily, tongue down, lips flanged, rhythmical sucking.  Audible Swallowing: Spontaneous and intermittent  Type of Nipple: Everted at rest and after stimulation  Comfort (Breast/Nipple): Soft / non-tender  Hold (Positioning): No assistance needed to correctly position  infant at breast.  LATCH Score: 10   Lactation Tools Discussed/Used Tools: Flanges Flange Size: 21 Pump Education: Milk Storage  Interventions Interventions: Breast feeding basics reviewed;Education;LC Services brochure  Discharge Discharge Education: Engorgement and breast care;Warning signs for feeding baby Pump: Referral [facility] for [facility]  Consult Status Consult Status: Complete    Recardo Hoit BS, IBCLC 10/30/2023, 8:29 AM

## 2023-10-31 ENCOUNTER — Encounter: Admitting: Obstetrics and Gynecology

## 2023-11-10 ENCOUNTER — Telehealth (HOSPITAL_COMMUNITY): Payer: Self-pay | Admitting: *Deleted

## 2023-11-10 NOTE — Telephone Encounter (Signed)
 11/10/2023  Name: Michelle Woodard MRN: [id number] DOB: 11/08/1995  Reason for Call:  Transition of Care Hospital Discharge Call  Contact Status: Patient Contact Status: Message  Language assistant needed:          Follow-Up Questions:    Van Postnatal Depression Scale:  In the Past 7 Days:    PHQ2-9 Depression Scale:     Discharge Follow-up:    Post-discharge interventions: NA  Mliss Sieve, RN 11/10/2023 12:56

## 2023-11-11 ENCOUNTER — Inpatient Hospital Stay (HOSPITAL_COMMUNITY)

## 2023-11-11 ENCOUNTER — Inpatient Hospital Stay (HOSPITAL_COMMUNITY): Admission: AD | Admit: 2023-11-11 | Source: Home / Self Care | Admitting: Family Medicine

## 2023-11-12 ENCOUNTER — Encounter: Payer: Self-pay | Admitting: Obstetrics and Gynecology

## 2023-11-21 ENCOUNTER — Telehealth: Admitting: Physician Assistant

## 2023-11-21 DIAGNOSIS — O9123 Nonpurulent mastitis associated with lactation: Secondary | ICD-10-CM | POA: Diagnosis not present

## 2023-11-21 MED ORDER — CEPHALEXIN 500 MG PO CAPS: 500.0000 mg | ORAL_CAPSULE | Freq: Three times a day (TID) | ORAL | 0 refills | Status: DC

## 2023-11-21 NOTE — Progress Notes (Signed)
 Virtual Visit Consent   Michelle Woodard, you are scheduled for a virtual visit with a [facility] provider today. Just as with appointments in the office, your consent must be obtained to participate. Your consent will be active for this visit and any virtual visit you may have with one of our providers in the next 365 days. If you have a MyChart account, a copy of this consent can be sent to you electronically.  As this is a virtual visit, video technology does not allow for your provider to perform a traditional examination. This may limit your provider's ability to fully assess your condition. If your provider identifies any concerns that need to be evaluated in person or the need to arrange testing (such as labs, EKG, etc.), we will make arrangements to do so. Although advances in technology are sophisticated, we cannot ensure that it will always work on either your end or our end. If the connection with a video visit is poor, the visit may have to be switched to a telephone visit. With either a video or telephone visit, we are not always able to ensure that we have a secure connection.  By engaging in this virtual visit, you consent to the provision of healthcare and authorize for your insurance to be billed (if applicable) for the services provided during this visit. Depending on your insurance coverage, you may receive a charge related to this service.  I need to obtain your verbal consent now. Are you willing to proceed with your visit today? Michelle Woodard has provided verbal consent on 11/21/2023 for a virtual visit (video or telephone). Michelle Woodard, [place]  Date: 11/21/2023 4:35 PM   Virtual Visit via Video Note   I, Michelle Woodard, connected with  Michelle Woodard  ([id number], [birth year]) on 11/21/23 at  4:30 PM EDT by a video-enabled telemedicine application and verified that I am speaking with the correct person using two identifiers.  Location: Patient: Virtual Visit  Location Patient: Home Provider: Virtual Visit Location Provider: Home Office   I discussed the limitations of evaluation and management by telemedicine and the availability of in person appointments. The patient expressed understanding and agreed to proceed.    History of Present Illness: Michelle Woodard is a 28 y.o. who identifies as a female who was assigned female at birth, and is being seen today for possible mastitis. Notes a red spot of her L medial breast yesterday that was firm and slightly tender. Today is a smaller area and no noted tenderness but has not resolved and wanted someone to look at the area. Denies fever, chills, malaise. Infant is feeding well and she continues to breastfeed without issue.    HPI: HPI  Problems:  Patient Active Problem List   Diagnosis Date Noted   Normal labor 10/29/2023   Anxiety during pregnancy, antepartum, third trimester 10/09/2023   History of intrauterine growth restriction in prior pregnancy, currently pregnant 07/01/2023   Rubella non-immune status, antepartum 05/25/2023   Supervision of other normal pregnancy, antepartum 03/25/2023   Nondisplaced fracture of medial malleolus of left tibia, initial encounter for closed fracture 02/21/2019   Nondisplaced avulsion fracture (chip fracture) of left talus, initial encounter for closed fracture 02/21/2019    Allergies: No Known Allergies Medications:  Current Outpatient Medications:    cephALEXin  (KEFLEX ) 500 MG capsule, Take 1 capsule (500 mg total) by mouth 3 (three) times daily for 7 days., Disp: 21 capsule, Rfl: 0   acetaminophen  (TYLENOL ) 325 MG tablet, Take 2  tablets (650 mg total) by mouth every 4 (four) hours as needed (for pain scale < 4)., Disp: 30 tablet, Rfl: 0   ferrous sulfate  325 (65 FE) MG EC tablet, Take 325 mg by mouth 3 (three) times daily with meals., Disp: , Rfl:    ferrous sulfate  325 (65 FE) MG tablet, Take 1 tablet (325 mg total) by mouth every other day., Disp: 15  tablet, Rfl: 3   ibuprofen  (ADVIL ) 600 MG tablet, Take 1 tablet (600 mg total) by mouth every 6 (six) hours., Disp: 30 tablet, Rfl: 0   Prenatal Vit-Fe Fumarate-FA (PRENATAL MULTIVITAMIN) TABS tablet, Take 1 tablet by mouth daily., Disp: 30 tablet, Rfl: 2   Prenatal Vit-Fe Fumarate-FA (PRENATAL VITAMIN PO), Take 1 tablet by mouth daily., Disp: , Rfl:    sertraline  (ZOLOFT ) 25 MG tablet, Take 1 tablet (25 mg total) by mouth daily., Disp: 30 tablet, Rfl: 11  Observations/Objective: Patient is well-developed, well-nourished in no acute distress.  Resting comfortably at home.  Head is normocephalic, atraumatic.  No labored breathing.  Speech is clear and coherent with logical content.  Patient is alert and oriented at baseline.  Discussed chaperone for exam of sensitive area (breast) but patient is ok proceeding over video without chaperone. L breast with area of redness noted medially to areola and extending further medially. No vesicular lesions, flaking. Area is not painful to palpation today.  Assessment and Plan: 1. Mastitis associated with lactation (Primary) - cephALEXin  (KEFLEX ) 500 MG capsule; Take 1 capsule (500 mg total) by mouth 3 (three) times daily for 7 days.  Dispense: 21 capsule; Refill: 0  Giving improvement in symptoms from yesterday today and no continued pain, hoping this is self limited and continuation of breastfeeding is helping to flush the ducts and reduce inflammation. However, will have her apply cold compresses over the area for now to reduce residual redness and inflammation. Continue breastfeeding. IF not continuing to substantially improve overnight or any worsening of symptoms she is to start antibiotics I have placed on file at pharmacy, and contact her OB for additional follow-up.  Follow Up Instructions: I discussed the assessment and treatment plan with the patient. The patient was provided an opportunity to ask questions and all were answered. The patient  agreed with the plan and demonstrated an understanding of the instructions.  A copy of instructions were sent to the patient via MyChart unless otherwise noted below.    The patient was advised to call back or seek an in-person evaluation if the symptoms worsen or if the condition fails to improve as anticipated.    Michelle Velma Lunger, PA-C

## 2023-11-21 NOTE — Patient Instructions (Signed)
 Michelle Woodard, thank you for joining Michelle Velma Lunger, PA-C for today's virtual visit.  While this provider is not your primary care provider (PCP), if your PCP is located in our provider database this encounter information will be shared with them immediately following your visit.   A [facility] MyChart account gives you access to today's visit and all your visits, tests, and labs performed at [facility]  click here if you don't have a [facility] MyChart account or go to [email or web address]  Consent: (Patient) Michelle Woodard provided verbal consent for this virtual visit at the beginning of the encounter.  Current Medications:  Current Outpatient Medications:    cephALEXin  (KEFLEX ) 500 MG capsule, Take 1 capsule (500 mg total) by mouth 3 (three) times daily for 7 days., Disp: 21 capsule, Rfl: 0   acetaminophen  (TYLENOL ) 325 MG tablet, Take 2 tablets (650 mg total) by mouth every 4 (four) hours as needed (for pain scale < 4)., Disp: 30 tablet, Rfl: 0   ferrous sulfate  325 (65 FE) MG EC tablet, Take 325 mg by mouth 3 (three) times daily with meals., Disp: , Rfl:    ferrous sulfate  325 (65 FE) MG tablet, Take 1 tablet (325 mg total) by mouth every other day., Disp: 15 tablet, Rfl: 3   ibuprofen  (ADVIL ) 600 MG tablet, Take 1 tablet (600 mg total) by mouth every 6 (six) hours., Disp: 30 tablet, Rfl: 0   Prenatal Vit-Fe Fumarate-FA (PRENATAL MULTIVITAMIN) TABS tablet, Take 1 tablet by mouth daily., Disp: 30 tablet, Rfl: 2   Prenatal Vit-Fe Fumarate-FA (PRENATAL VITAMIN PO), Take 1 tablet by mouth daily., Disp: , Rfl:    sertraline  (ZOLOFT ) 25 MG tablet, Take 1 tablet (25 mg total) by mouth daily., Disp: 30 tablet, Rfl: 11   Medications ordered in this encounter:  Meds ordered this encounter  Medications   cephALEXin  (KEFLEX ) 500 MG capsule    Sig: Take 1 capsule (500 mg total) by mouth 3 (three) times daily for 7 days.    Dispense:  21 capsule    Refill:  0     Supervising Provider:   BLAISE ALEENE KIDD [[id number]]     *If you need refills on other medications prior to your next appointment, please contact your pharmacy*  Follow-Up: Call back or seek an in-person evaluation if the symptoms worsen or if the condition fails to improve as anticipated.   Virtual Care [phone]  Other Instructions Please continue to hydrate and rest. Continue breastfeeding. Hopefully symptoms will continue to improve and resolve on their own, but if you note any increased redness, warmth or recurrence of tenderness, please start the antibiotic I have placed at the pharmacy, taking as directed. Cold compresses as discussed. Can use Tylenol  and Ibuprofen  OTC.  If you start the antibiotics, please contact your OBGYN for follow-up.  Mastitis  Mastitis is irritation and swelling (inflammation) in an area of the breast. This most often happens in females who are breastfeeding, but it can happen to anyone. This includes males. It will sometimes go away on its own. Other times, mastitis may need treatment. What are the causes? In people who are not breastfeeding, mastitis is often caused by germs (bacteria) that get into breast tissue. This can happen through cuts, cracks, or openings in the skin. Other causes include: Nipple piercing. Some types of breast cancer. What are the signs or symptoms? Swelling, redness, tenderness, and pain in the breast. The area may also feel warm. Swelling of the glands  under the arm. Fluid coming from the nipple. Feeling tired (fatigue). Headache and body aches. Fever and chills. Vomiting or feeling like you may vomit (nauseous). Symptoms can last 2-5 days. The pain and redness are the worst on days 2 and 3. This will often go away by day 5. If an infection develops and is not treated, pus or fluid may form under the skin (abscess). How is this diagnosed? Mastitis is often diagnosed based on a physical exam and your  symptoms. You may also have other tests, such as: Blood tests. Fluid tests. If fluid collects under the skin, it may be tested to find if germs are present. Breast X-rays or ultrasounds. How is this treated? Treatment may include: Using hot or cold compresses. Pain medicine. Antibiotic or steroid medicine. Rest. Drinking plenty of fluids. Removing pus or fluid from under the skin. Follow these instructions at home: Breast care  Keep your nipples clean and dry. If told, put ice on the affected area. Put ice in a plastic bag. Place a towel between your skin and the bag. Leave the ice on for 20 minutes, 2-3 times a day. If told, put heat on the affected area. Do this as often as told by your doctor. Use the heat source that your doctor recommends, such as a moist heat pack or heating pad. Place a towel between your skin and the heat source. Leave the heat on for 20-30 minutes. If your skin turns bright red, take off the ice or heat right away to prevent skin damage. The risk of damage is higher if you cannot feel pain, heat, or cold. Medicines Take over-the-counter and prescription medicines only as told by your doctor. If you were prescribed antibiotics, take them as told by your doctor. Do not stop using them even if you start to feel better. Contact a doctor if: You have pus-like fluid leaking from the breast. You have a fever. Your pain and swelling get worse. Your symptoms do not start to get better within 2 days of starting treatment. Your pain is not helped by medicine. Get help right away if: You have a red line going from your breast toward your armpit. This information is not intended to replace advice given to you by your health care provider. Make sure you discuss any questions you have with your health care provider. Document Revised: 12/13/2021 Document Reviewed: 12/13/2021 Elsevier Patient Education  2024 Elsevier Inc.   If you have been instructed to have an  in-person evaluation today at a local Urgent Care facility, please use the link below. It will take you to a list of all of our available [facility], including address, phone number and hours of operation. Please do not delay care.  [facility]  If you or a family member do not have a primary care provider, use the link below to schedule a visit and establish care. When you choose a [facility] primary care physician or advanced practice provider, you gain a long-term partner in health. Find a Primary Care Provider  Learn more about [facility]'s in-office and virtual care options: [facility] - Get Care Now

## 2023-11-27 ENCOUNTER — Telehealth: Payer: Self-pay

## 2023-11-27 ENCOUNTER — Ambulatory Visit: Admitting: Obstetrics and Gynecology

## 2023-11-28 ENCOUNTER — Encounter (HOSPITAL_COMMUNITY): Payer: Self-pay

## 2023-11-28 ENCOUNTER — Emergency Department (HOSPITAL_COMMUNITY)
Admission: EM | Admit: 2023-11-28 | Discharge: 2023-11-28 | Disposition: A | Discharge status: Home / Self Care | Attending: Emergency Medicine | Admitting: Emergency Medicine

## 2023-11-28 ENCOUNTER — Telehealth: Admitting: Physician Assistant

## 2023-11-28 ENCOUNTER — Emergency Department (HOSPITAL_COMMUNITY)

## 2023-11-28 ENCOUNTER — Other Ambulatory Visit: Payer: Self-pay

## 2023-11-28 DIAGNOSIS — R000 Tachycardia, unspecified: Secondary | ICD-10-CM | POA: Diagnosis not present

## 2023-11-28 DIAGNOSIS — O9123 Nonpurulent mastitis associated with lactation: Secondary | ICD-10-CM | POA: Diagnosis not present

## 2023-11-28 DIAGNOSIS — N644 Mastodynia: Secondary | ICD-10-CM | POA: Diagnosis not present

## 2023-11-28 DIAGNOSIS — O9089 Other complications of the puerperium, not elsewhere classified: Secondary | ICD-10-CM | POA: Insufficient documentation

## 2023-11-28 DIAGNOSIS — N61 Mastitis without abscess: Secondary | ICD-10-CM

## 2023-11-28 LAB — COMPREHENSIVE METABOLIC PANEL WITH GFR
ALT: 15 U/L (ref 0–44)
AST: 17 U/L (ref 15–41)
Albumin: 3.6 g/dL (ref 3.5–5.0)
Alkaline Phosphatase: 88 U/L (ref 38–126)
Anion gap: 10 (ref 5–15)
BUN: 6 mg/dL (ref 6–20)
CO2: 19 mmol/L — ABNORMAL LOW (ref 22–32)
Calcium: 8.6 mg/dL — ABNORMAL LOW (ref 8.9–10.3)
Chloride: 105 mmol/L (ref 98–111)
Creatinine, Ser: 0.65 mg/dL (ref 0.44–1.00)
GFR, Estimated: >60 mL/min (ref >60)
Glucose, Bld: 94 mg/dL (ref 70–99)
Potassium: 3.4 mmol/L — ABNORMAL LOW (ref 3.5–5.1)
Sodium: 134 mmol/L — ABNORMAL LOW (ref 135–145)
Total Bilirubin: 0.7 mg/dL (ref 0.0–1.2)
Total Protein: 6.4 g/dL — ABNORMAL LOW (ref 6.5–8.1)

## 2023-11-28 LAB — PROTIME-INR
INR: 1.1 (ref 0.8–1.2)
Prothrombin Time: 15.1 s (ref 11.4–15.2)

## 2023-11-28 LAB — CBC WITH DIFFERENTIAL/PLATELET
Abs Immature Granulocytes: 0.05 K/uL (ref 0.00–0.07)
Basophils Absolute: 0.1 K/uL (ref 0.0–0.1)
Basophils Relative: 1 %
Eosinophils Absolute: 0 K/uL (ref 0.0–0.5)
Eosinophils Relative: 0 %
HCT: 39.9 % (ref 36.0–46.0)
Hemoglobin: 12.6 g/dL (ref 12.0–15.0)
Immature Granulocytes: 1 %
Lymphocytes Relative: 12 %
Lymphs Abs: 1.2 K/uL (ref 0.7–4.0)
MCH: 25.4 pg — ABNORMAL LOW (ref 26.0–34.0)
MCHC: 31.6 g/dL (ref 30.0–36.0)
MCV: 80.4 fL (ref 80.0–100.0)
Monocytes Absolute: 0.8 K/uL (ref 0.1–1.0)
Monocytes Relative: 8 %
Neutro Abs: 7.7 K/uL (ref 1.7–7.7)
Neutrophils Relative %: 78 %
Platelets: 273 K/uL (ref 150–400)
RBC: 4.96 MIL/uL (ref 3.87–5.11)
RDW: 15.3 % (ref 11.5–15.5)
WBC: 9.7 K/uL (ref 4.0–10.5)
nRBC: 0 % (ref 0.0–0.2)

## 2023-11-28 LAB — URINALYSIS, W/ REFLEX TO CULTURE (INFECTION SUSPECTED)
Bacteria, UA: NONE SEEN
Bilirubin Urine: NEGATIVE
Glucose, UA: NEGATIVE mg/dL
Hgb urine dipstick: NEGATIVE
Ketones, ur: NEGATIVE mg/dL
Leukocytes,Ua: NEGATIVE
Nitrite: NEGATIVE
Protein, ur: NEGATIVE mg/dL
RBC / HPF: NONE SEEN RBC/hpf (ref 0–5)
Specific Gravity, Urine: <1.005 — ABNORMAL LOW (ref 1.005–1.030)
pH: 7 (ref 5.0–8.0)

## 2023-11-28 LAB — I-STAT CHEM 8, ED
BUN: 9 mg/dL (ref 6–20)
Calcium, Ion: 1.08 mmol/L — ABNORMAL LOW (ref 1.15–1.40)
Chloride: 103 mmol/L (ref 98–111)
Creatinine, Ser: 0.7 mg/dL (ref 0.44–1.00)
Glucose, Bld: 91 mg/dL (ref 70–99)
HCT: 39 % (ref 36.0–46.0)
Hemoglobin: 13.3 g/dL (ref 12.0–15.0)
Potassium: 4 mmol/L (ref 3.5–5.1)
Sodium: 135 mmol/L (ref 135–145)
TCO2: 22 mmol/L (ref 22–32)

## 2023-11-28 LAB — HCG, SERUM, QUALITATIVE: Preg, Serum: NEGATIVE

## 2023-11-28 LAB — I-STAT CG4 LACTIC ACID, ED
Lactic Acid, Venous: 0.9 mmol/L (ref 0.5–1.9)
Lactic Acid, Venous: 1.1 mmol/L (ref 0.5–1.9)

## 2023-11-28 MED ORDER — CEPHALEXIN 500 MG PO CAPS: 500.0000 mg | ORAL_CAPSULE | Freq: Three times a day (TID) | ORAL | 0 refills | Status: AC

## 2023-11-28 MED ORDER — CEPHALEXIN 250 MG PO CAPS
500.0000 mg | ORAL_CAPSULE | Freq: Once | ORAL | Status: AC
Start: 2023-11-28 — End: 2023-11-28
  Administered 2023-11-28: 500 mg via ORAL
  Filled 2023-11-28: qty 2

## 2023-11-28 MED ORDER — LACTATED RINGERS IV BOLUS
1000.0000 mL | Freq: Once | INTRAVENOUS | Status: AC
  Administered 2023-11-28: 1000 mL via INTRAVENOUS

## 2023-11-28 MED ORDER — ACETAMINOPHEN 500 MG PO TABS
1000.0000 mg | ORAL_TABLET | Freq: Once | ORAL | Status: AC
  Administered 2023-11-28: 1000 mg via ORAL
  Filled 2023-11-28: qty 2

## 2023-11-28 NOTE — Progress Notes (Signed)
 Virtual Visit Consent   Michelle Woodard, you are scheduled for a virtual visit with a [facility] provider today. Just as with appointments in the office, your consent must be obtained to participate. Your consent will be active for this visit and any virtual visit you may have with one of our providers in the next 365 days. If you have a MyChart account, a copy of this consent can be sent to you electronically.  As this is a virtual visit, video technology does not allow for your provider to perform a traditional examination. This may limit your provider's ability to fully assess your condition. If your provider identifies any concerns that need to be evaluated in person or the need to arrange testing (such as labs, EKG, etc.), we will make arrangements to do so. Although advances in technology are sophisticated, we cannot ensure that it will always work on either your end or our end. If the connection with a video visit is poor, the visit may have to be switched to a telephone visit. With either a video or telephone visit, we are not always able to ensure that we have a secure connection.  By engaging in this virtual visit, you consent to the provision of healthcare and authorize for your insurance to be billed (if applicable) for the services provided during this visit. Depending on your insurance coverage, you may receive a charge related to this service.  I need to obtain your verbal consent now. Are you willing to proceed with your visit today? Henny Strauch has provided verbal consent on 11/28/2023 for a virtual visit (video or telephone). [name], PA-C  Date: 11/28/2023 9:44 AM   Virtual Visit via Video Note   I, [name], connected with  Michelle Woodard  ([id number], 1995-12-21) on 11/28/23 at  9:30 AM EDT by a video-enabled telemedicine application and verified that I am speaking with the correct person using two identifiers.  Location: Patient: Virtual Visit  Location Patient: Home Provider: Virtual Visit Location Provider: Home Office   I discussed the limitations of evaluation and management by telemedicine and the availability of in person appointments. The patient expressed understanding and agreed to proceed.    History of Present Illness: Michelle Woodard is a 28 y.o. who identifies as a female who was assigned female at birth, and is being seen today for fever and breast pain.  HPI: Fever  This is a recurrent problem. The current episode started yesterday (Was seen on 11/21/23, but symptoms had been improving. Had Keflex  prescribed, but never filled as symptoms resolved spontaneously, but then recurred yesterday and worsening). The problem occurs constantly. The problem has been gradually worsening. Her temperature was unmeasured (subjective, chills, body aches, breast tenderness) prior to arrival. Associated symptoms include chest pain (breast), headaches, muscle aches and sleepiness. Pertinent negatives include no congestion, coughing, diarrhea, nausea or vomiting. Associated symptoms comments: Right breast tenderness and redness, left breast tenderness. The treatment provided no relief.     Problems:  Patient Active Problem List   Diagnosis Date Noted   Normal labor 10/29/2023   Anxiety during pregnancy, antepartum, third trimester 10/09/2023   History of intrauterine growth restriction in prior pregnancy, currently pregnant 07/01/2023   Rubella non-immune status, antepartum 05/25/2023   Supervision of other normal pregnancy, antepartum 03/25/2023   Nondisplaced fracture of medial malleolus of left tibia, initial encounter for closed fracture 02/21/2019   Nondisplaced avulsion fracture (chip fracture) of left talus, initial encounter for closed fracture 02/21/2019  Allergies: No Known Allergies Medications:  Current Outpatient Medications:    acetaminophen  (TYLENOL ) 325 MG tablet, Take 2 tablets (650 mg total) by mouth every 4 (four)  hours as needed (for pain scale < 4)., Disp: 30 tablet, Rfl: 0   cephALEXin  (KEFLEX ) 500 MG capsule, Take 1 capsule (500 mg total) by mouth 3 (three) times daily for 7 days., Disp: 21 capsule, Rfl: 0   ferrous sulfate  325 (65 FE) MG EC tablet, Take 325 mg by mouth 3 (three) times daily with meals., Disp: , Rfl:    ferrous sulfate  325 (65 FE) MG tablet, Take 1 tablet (325 mg total) by mouth every other day., Disp: 15 tablet, Rfl: 3   ibuprofen  (ADVIL ) 600 MG tablet, Take 1 tablet (600 mg total) by mouth every 6 (six) hours., Disp: 30 tablet, Rfl: 0   Prenatal Vit-Fe Fumarate-FA (PRENATAL MULTIVITAMIN) TABS tablet, Take 1 tablet by mouth daily., Disp: 30 tablet, Rfl: 2   Prenatal Vit-Fe Fumarate-FA (PRENATAL VITAMIN PO), Take 1 tablet by mouth daily., Disp: , Rfl:    sertraline  (ZOLOFT ) 25 MG tablet, Take 1 tablet (25 mg total) by mouth daily., Disp: 30 tablet, Rfl: 11  Observations/Objective: Patient is well-developed, well-nourished in no acute distress. Patient does appear to feel unwell Resting comfortably at home.  Head is normocephalic, atraumatic.  No labored breathing.  Speech is clear and coherent with logical content.  Patient is alert and oriented at baseline.    Assessment and Plan: 1. Mastitis associated with lactation (Primary) - cephALEXin  (KEFLEX ) 500 MG capsule; Take 1 capsule (500 mg total) by mouth 3 (three) times daily for 7 days.  Dispense: 21 capsule; Refill: 0  - Worsening - Keflex  prescribed - Warm compresses - Continue to breastfeed or pump - Lactation Massages - Tylenol  as needed for fever and pain - Seek in person evaluation if worsening  Follow Up Instructions: I discussed the assessment and treatment plan with the patient. The patient was provided an opportunity to ask questions and all were answered. The patient agreed with the plan and demonstrated an understanding of the instructions.  A copy of instructions were sent to the patient via MyChart unless  otherwise noted below.    The patient was advised to call back or seek an in-person evaluation if the symptoms worsen or if the condition fails to improve as anticipated.    [name], PA-C

## 2023-11-28 NOTE — Discharge Instructions (Signed)
 Take the antibiotics as previously prescribed.  Make an appointment to follow-up with your doctor for a close recheck.  Return to the emergency room if you have any worsening symptoms.

## 2023-11-28 NOTE — ED Provider Triage Note (Signed)
 Emergency Medicine Provider Triage Evaluation Note  Michelle Woodard , a 28 y.o. female  was evaluated in triage.  Pt complains of generalized weakness and bodyaches as well as right-sided breast pain, previously diagnosed with mastitis and started on cephalexin , had previously been diagnosed and managed for mastitis on the opposite breast however did not take antibiotics as it began to clear despite antibiotic therapy.  Stated that she also had fever associated with her symptoms..  Review of Systems  Positive: As above Negative:   Physical Exam  BP 101/73   Pulse (!) 136   Temp 98.7 F (37.1 C) (Oral)   Resp 20   Ht 4' 11 (1.499 m)   Wt 52.2 kg   LMP 01/08/2023 (Approximate)   SpO2 99%   Breastfeeding Yes   BMI 23.23 kg/m  Gen:   Awake, no distress   Resp:  Normal effort  MSK:   Moves extremities without difficulty  Other:    Medical Decision Making  Medically screening exam initiated at 1:27 PM.  Appropriate orders placed.  Michelle Woodard was informed that the remainder of the evaluation will be completed by another provider, this initial triage assessment does not replace that evaluation, and the importance of remaining in the ED until their evaluation is complete.  Secondary to recently diagnosed infection and fever along with tachycardia, initiated sepsis screening order set.   Myriam Dorn BROCKS, [place] 11/28/23 1329

## 2023-11-28 NOTE — ED Triage Notes (Signed)
 Patient states that she had a tele health visit this morning and that they prescribed her antibiotics for mastitis and she has taken one does but she is afraid that she is septic so she came in for evaluation. Patients symptoms started last night.  Dizziness, chills, fever, red warm to touch knot on right breast.

## 2023-11-28 NOTE — ED Triage Notes (Signed)
 Pt 1 month postpartum with dx of mastitis, pt co fever, chills, nausea.

## 2023-11-28 NOTE — ED Provider Notes (Signed)
 [facility] EMERGENCY DEPARTMENT AT [facility] Provider Note   CSN: [id number] Arrival date & time: 11/28/23  1239     Patient presents with: Postpartum Complications   Michelle Woodard is a 28 y.o. female.   Patient is a 28 year old female with a history of anxiety and recent delivery 1 month ago who is currently breast-feeding and presenting today with complaints of right breast pain that started yesterday but then developed myalgias, fever, chills and feeling generally on at well but started this morning.  Temperature up to 101.  She spoke with telehealth who told her that she most likely had mastitis and prescribed her Keflex  which she took 1 dose.  However due to feeling unwell she wanted to be further evaluated.  She has had some nausea today but denies any vomiting or diarrhea.  Denies any dysuria frequency or urgency.  She is still had some bleeding since having her child but states it had almost completely resolved and is now increased a little bit but denies any lower abdominal pain.  She last breast-fed on the affected side at 10:00 and reports she is due to pump on that side anytime.  She has not taken any medication for her symptoms other than the Keflex .  She denies any known sick contacts.  Nobody is sick at home.  No cough or congestion.  The history is provided by the patient and the spouse.       Prior to Admission medications   Medication Sig Start Date End Date Taking? Authorizing Provider  acetaminophen  (TYLENOL ) 325 MG tablet Take 2 tablets (650 mg total) by mouth every 4 (four) hours as needed (for pain scale < 4). 10/30/23   Emilio Delilah HERO, CNM  cephALEXin  (KEFLEX ) 500 MG capsule Take 1 capsule (500 mg total) by mouth 3 (three) times daily for 7 days. 11/28/23 12/05/23  Vivienne Delon HERO, PA-C  ferrous sulfate  325 (65 FE) MG EC tablet Take 325 mg by mouth 3 (three) times daily with meals.    [provider]  ferrous sulfate  325 (65 FE) MG  tablet Take 1 tablet (325 mg total) by mouth every other day. 10/30/23   Emilio Delilah HERO, CNM  ibuprofen  (ADVIL ) 600 MG tablet Take 1 tablet (600 mg total) by mouth every 6 (six) hours. 10/30/23   Emilio Delilah HERO, CNM  Prenatal Vit-Fe Fumarate-FA (PRENATAL MULTIVITAMIN) TABS tablet Take 1 tablet by mouth daily. 10/30/23   Emilio Delilah HERO, CNM  Prenatal Vit-Fe Fumarate-FA (PRENATAL VITAMIN PO) Take 1 tablet by mouth daily.    [provider]  sertraline  (ZOLOFT ) 25 MG tablet Take 1 tablet (25 mg total) by mouth daily. 10/16/23   Abigail Rollo DASEN, MD    Allergies: Patient has no known allergies.    Review of Systems  Updated Vital Signs BP 101/73   Pulse (!) 136   Temp 98.7 F (37.1 C) (Oral)   Resp 20   Ht 4' 11 (1.499 m)   Wt 52.2 kg   LMP 01/08/2023 (Approximate)   SpO2 99%   Breastfeeding Yes   BMI 23.23 kg/m   Physical Exam Vitals and nursing note reviewed.  Constitutional:      General: She is not in acute distress.    Appearance: She is well-developed.  HENT:     Head: Normocephalic and atraumatic.  Eyes:     Conjunctiva/sclera: Conjunctivae normal.     Pupils: Pupils are equal, round, and reactive to light.  Cardiovascular:  Rate and Rhythm: Regular rhythm. Tachycardia present.     Heart sounds: No murmur heard. Pulmonary:     Effort: Pulmonary effort is normal. No respiratory distress.     Breath sounds: Normal breath sounds. No wheezing or rales.  Chest:    Abdominal:     General: There is no distension.     Palpations: Abdomen is soft.     Tenderness: There is no abdominal tenderness. There is no guarding or rebound.  Musculoskeletal:        General: No tenderness. Normal range of motion.     Cervical back: Normal range of motion and neck supple.     Right lower leg: No edema.     Left lower leg: No edema.  Skin:    General: Skin is warm and dry.     Findings: No erythema or rash.  Neurological:     Mental Status: She is alert and  oriented to person, place, and time.  Psychiatric:        Behavior: Behavior normal.     (all labs ordered are listed, but only abnormal results are displayed) Labs Reviewed  CULTURE, BLOOD (ROUTINE X 2)  CULTURE, BLOOD (ROUTINE X 2)  COMPREHENSIVE METABOLIC PANEL WITH GFR  CBC WITH DIFFERENTIAL/PLATELET  PROTIME-INR  HCG, SERUM, QUALITATIVE  URINALYSIS, W/ REFLEX TO CULTURE (INFECTION SUSPECTED)  I-STAT CG4 LACTIC ACID, ED  I-STAT CHEM 8, ED    EKG: None  Radiology: No results found.   Procedures   Medications Ordered in the ED - No data to display                                  Medical Decision Making  Pt presenting today with a complaint that caries a high risk for morbidity and mortality. Here today with the above complaints.  Patient is noted to be tachycardic in the 130s to 120s while in the room.  Does have erythema and tenderness in the right breast concerning for possible mastitis.  She is afebrile here with a normal blood pressure.  She denies any chest pain or shortness of breath.  No abdominal pain or urinary complaints.  Patient given Tylenol  due to the complaint of myalgias and IV fluids.  She is also going to pump from that breast.     Final diagnoses:  None    ED Discharge Orders     None          Doretha Folks, MD 11/28/23 1515

## 2023-11-28 NOTE — ED Provider Notes (Signed)
 Care was taken over from Dr. Doretha.  Patient presented with redness and pain to her right breast concerning for mastitis.  She has already been started on Keflex  by her provider.  She is only taking 1 dose.  She is overall well-appearing.  She was tachycardic on arrival but this has resolved.  She was given IV fluids.  She does not have any concerns for sepsis.  Her lactate is normal.  WBC count is normal.  No other source of infection.  Urine does not look consistent with infection.  Chest x-ray does not show any evidence of pneumonia.  She was discharged home in good condition.  Will continue breast-feeding.  Will take the Keflex .  Encouraged her to have close follow-up with her PCP.  Return precautions were given.   Lenor Hollering, MD 11/28/23 [place]

## 2023-11-28 NOTE — Patient Instructions (Signed)
 Michelle Woodard, thank you for joining Michelle Woodard Dickinson, PA-C for today's virtual visit.  While this provider is not your primary care provider (PCP), if your PCP is located in our provider database this encounter information will be shared with them immediately following your visit.   A [facility] MyChart account gives you access to today's visit and all your visits, tests, and labs performed at [facility]  click here if you don't have a [facility] MyChart account or go to [email or web address]  Consent: (Patient) Michelle Woodard provided verbal consent for this virtual visit at the beginning of the encounter.  Current Medications:  Current Outpatient Medications:    acetaminophen  (TYLENOL ) 325 MG tablet, Take 2 tablets (650 mg total) by mouth every 4 (four) hours as needed (for pain scale < 4)., Disp: 30 tablet, Rfl: 0   cephALEXin  (KEFLEX ) 500 MG capsule, Take 1 capsule (500 mg total) by mouth 3 (three) times daily for 7 days., Disp: 21 capsule, Rfl: 0   ferrous sulfate  325 (65 FE) MG EC tablet, Take 325 mg by mouth 3 (three) times daily with meals., Disp: , Rfl:    ferrous sulfate  325 (65 FE) MG tablet, Take 1 tablet (325 mg total) by mouth every other day., Disp: 15 tablet, Rfl: 3   ibuprofen  (ADVIL ) 600 MG tablet, Take 1 tablet (600 mg total) by mouth every 6 (six) hours., Disp: 30 tablet, Rfl: 0   Prenatal Vit-Fe Fumarate-FA (PRENATAL MULTIVITAMIN) TABS tablet, Take 1 tablet by mouth daily., Disp: 30 tablet, Rfl: 2   Prenatal Vit-Fe Fumarate-FA (PRENATAL VITAMIN PO), Take 1 tablet by mouth daily., Disp: , Rfl:    sertraline  (ZOLOFT ) 25 MG tablet, Take 1 tablet (25 mg total) by mouth daily., Disp: 30 tablet, Rfl: 11   Medications ordered in this encounter:  Meds ordered this encounter  Medications   cephALEXin  (KEFLEX ) 500 MG capsule    Sig: Take 1 capsule (500 mg total) by mouth 3 (three) times daily for 7 days.    Dispense:  21 capsule    Refill:  0     Supervising Provider:   BLAISE ALEENE KIDD [[id number]]     *If you need refills on other medications prior to your next appointment, please contact your pharmacy*  Follow-Up: Call back or seek an in-person evaluation if the symptoms worsen or if the condition fails to improve as anticipated.  [facility] [phone]  Other Instructions Breastfeeding and Mastitis Mastitis is inflammation of the breast. It can happen in people who are breastfeeding. This can make breastfeeding painful. Mastitis will sometimes go away on its own with continued breastfeeding or pumping, especially if it is not caused by an infection. A health care provider will help decide if medical treatment is needed. What are the causes? Inflammation may be caused by a blocked milkduct, which can happen when too much milk builds up in the breast. Mastitis is often caused by an infection from germs (bacteria). Bacteria may enter the breast through cuts, cracks, or openings in the skin near the nipple area. Cracks in the skin often develop when your child does not latch the correct way on to your breast. What increases the risk? The following factors may make you more likely to develop this condition: Having had mastitis before. Not using correct breastfeeding techniques. Not eating enough healthy foods and not drinking enough water. Smoking. Stress. Tiredness (fatigue). What are the signs or symptoms? Symptoms of this condition include: Swelling, redness, and pain  in an area of your breast. These usually affect the upper part of the breast, toward the armpit area. In most cases, these symptoms affect only one breast. In some cases, the symptoms may happen in both breasts at the same time and affect a larger part of the breast. Swelling of the glands under your arm on the same side as the redness, swelling, and pain. Fatigue, headache, and muscle aches. Fever. Symptoms usually last 2-5 days. Breast pain and  redness are at their worst on days 2 and 3, and they usually go away by day 5. If not treated appropriately a collection of pus (abscess) may develop. How is this diagnosed? This condition is usually diagnosed based on: Your symptoms. A physical exam. In some cases, tests may be done, such as: Blood tests. Mammogram. Ultrasound. Fluid tests. If an abscess has developed, the fluid in the abscess may be checked to see whether there are bacteria in it. Breast milk testing. A sample of your breast milk may be tested for bacteria. How is this treated? This condition will sometimes go away on its own with continued breastfeeding or pumping. Your provider may choose to wait 24 hours after first seeing you to decide whether treatment is needed. If treatment is needed, it may include: Continuing to breastfeed or pump from both breasts to allow enough milk flow and prevent an abscess from forming. Rest. Drinking more fluids. Pain medicine. Antibiotics. Removing fluid from an abscess, if you have one. Follow these instructions at home: Medicines Take over-the-counter and prescription medicines only as told by your provider. If you were prescribed antibiotics, take them as told by your provider. Do not stop using the antibiotic even if you start to feel better. Managing pain and swelling  If told, put ice on the affected area of your breast right after breastfeeding or pumping. Put ice in a plastic bag. Place a towel between your skin and the bag. Leave the ice on for 20 minutes. If your skin turns bright red, remove the ice right away to prevent skin damage. The risk of damage is higher if you cannot feel pain, heat, or cold. Breast care Keep your nipples clean and dry. Do not wear a tight bra or underwire bra. Wear a soft, supportive bra. Check your nipples for any cracks or blisters. If you find any, talk with your provider or breastfeeding specialist (lactation consultant). Breastfeeding  and pumping tips Continue to breastfeed your baby on demand. This means feeding anytime your baby shows signs of hunger or you need to reduce the fullness of your breasts. Ask your provider or lactation consultant whether you need to change your breastfeeding or pumping routine. Alternate the breast you offer first at each feeding to make sure your baby feeds from both breasts equally. Avoid using nipple shields for feedings, if possible. Ask a lactation consultant for help if needed. If told, apply heat to the breast right before breastfeeding or pumping. Use the heat source that your provider recommends, such as a moist heat pack or a heating pad. Use gentle breast massage during feeding or pumping sessions only as told by your provider or lactation consultant. Avoid allowing your breasts to become too full of milk (engorged). If your breasts are engorged, you can hand express a small amount of milk for comfort. If you are pumping, continue to pump on the same schedule as you were before. In the breast with mastitis, pump until very little milk is coming out. Do not  empty your breast. Emptying your breast causes your body to make more milk and can make symptoms worse. General instructions Drink enough fluid to keep your pee (urine) pale yellow. This is especially important if you have a fever. Get plenty of rest. Keep all follow-up visits. Your provider will need to check how breastfeeding is going. Where to find more information [employer]: llli.org Contact a health care provider if: You have become frustrated with breastfeeding or breast milk pumping. You have pain or discomfort in your breasts such as: Continued pain with breastfeeding or breast milk pumping. Breast engorgement that does not improve after 48-72 hours. Your symptoms do not improve with treatment. Your symptoms return after you have recovered from a breast infection. This information is not intended to  replace advice given to you by your health care provider. Make sure you discuss any questions you have with your health care provider. Document Revised: 11/12/2021 Document Reviewed: 11/12/2021 Else[employer] Patient Education  2024 [employer]   If you have been instructed to have an in-person evaluation today at a local Urgent Care facility, please use the link below. It will take you to a list of all of our available [facility], including address, phone number and hours of operation. Please do not delay care.  [facility]  If you or a family member do not have a primary care provider, use the link below to schedule a visit and establish care. When you choose a [facility] primary care physician or advanced practice provider, you gain a long-term partner in health. Find a Primary Care Provider  Learn more about [facility]'s in-office and virtual care options: [place] [facility] - Get Care Now

## 2023-12-03 LAB — CULTURE, BLOOD (ROUTINE X 2)
Culture: NO GROWTH
Culture: NO GROWTH

## 2023-12-10 ENCOUNTER — Other Ambulatory Visit: Payer: Self-pay | Admitting: Obstetrics and Gynecology

## 2023-12-10 DIAGNOSIS — F419 Anxiety disorder, unspecified: Secondary | ICD-10-CM
